# Patient Record
Sex: Male | Born: 1945
Health system: Southern US, Community
[De-identification: ages and names within clinical notes are randomized; demographics above are authoritative.]

## PROBLEM LIST (undated history)

## (undated) DIAGNOSIS — E785 Hyperlipidemia, unspecified: Secondary | ICD-10-CM

## (undated) DIAGNOSIS — I10 Essential (primary) hypertension: Secondary | ICD-10-CM

---

## 2014-03-03 ENCOUNTER — Emergency Department (HOSPITAL_COMMUNITY)
Admission: EM | Admit: 2014-03-03 | Discharge: 2014-03-03 | Disposition: A | Discharge status: Home / Self Care | Payer: 59 | Source: Home / Self Care | Attending: Emergency Medicine | Admitting: Emergency Medicine

## 2014-03-03 ENCOUNTER — Encounter (HOSPITAL_COMMUNITY): Payer: Self-pay | Admitting: *Deleted

## 2014-03-03 DIAGNOSIS — K040 Pulpitis: Secondary | ICD-10-CM

## 2014-03-03 DIAGNOSIS — K0401 Reversible pulpitis: Secondary | ICD-10-CM

## 2014-03-03 MED ORDER — HYDROCODONE-ACETAMINOPHEN 5-325 MG PO TABS: ORAL_TABLET | ORAL | Status: DC

## 2014-03-03 MED ORDER — IBUPROFEN 800 MG PO TABS: 800.0000 mg | ORAL_TABLET | Freq: Once | ORAL | Status: DC

## 2014-03-03 MED ORDER — AMOXICILLIN-POT CLAVULANATE 875-125 MG PO TABS: 1.0000 | ORAL_TABLET | Freq: Two times a day (BID) | ORAL | Status: DC

## 2014-03-03 MED ORDER — MELOXICAM 15 MG PO TABS: 15.0000 mg | ORAL_TABLET | Freq: Every day | ORAL | Status: DC

## 2014-03-03 MED ORDER — IBUPROFEN 800 MG PO TABS
ORAL_TABLET | ORAL | Status: AC
  Filled 2014-03-03: qty 1

## 2014-03-03 NOTE — Discharge Instructions (Signed)
Look up the [facility] [employer] for free dental clinics. Http://www.ncdental.org/ncds/Schedule.asp ° °Get there early and be prepared to wait. [employer] and [employer] have dental hygienist schools that provide low cost routine dental care.  ° °Other resources: °[facility] °[address] °[place], [place] °[phone] ° °Patients with Medicaid: °[facility]                     [facility] °[address]                                [address] °Phone:  [phone]                                                  Phone:  [phone] ° °Dr. Janice Civils °[address] °[phone] ° °If unable to pay or uninsured, contact:  [facility] or [facility] to become qualified for the adult dental clinic. ° °No matter what dental problem you have, it will not get better unless you get good dental care.  If the tooth is not taken care of, your symptoms will come back in time and you will be visiting us again in the Urgent Care Center with a bad toothache.  So, see your dentist as soon as possible.  If you don't have a dentist, we can give you a list of dentists.  Sometimes the most cost effective treatment is removal of the tooth.  This can be done very inexpensively through one of the low cost [facility] such as the facility on [address] in [place] ([phone]).  The downside to this is that you will have one less tooth and this can effect your ability to chew. ° °Some other things that can be done for a dental infection include the following: ° °· Rinse your mouth out with hot salt water (1/2 tsp of table salt and a pinch of baking soda in 8 oz of hot water).  You can do this every 2 or 3 hours. °· Avoid cold foods, beverages, and cold air.  This will make your symptoms worse. °· Sleep with your head elevated.  Sleeping flat will cause your gums and oral tissues to swell and make them hurt  more.  You can sleep on several pillows.  Even better is to sleep in a recliner with your head higher than your heart. °· For mild to moderate pain, you can take Tylenol, ibuprofen, or Aleve. °· External application of heat by a heating pad, hot water bottle, or hot wet towel can help with pain and speed healing.  You can do this every 2 to 3 hours. Do not fall asleep on a heating pad since this can cause a burn.  ° °Go to www.goodrx.com to look up your medications. This will give you a list of where you can find your prescriptions at the most affordable prices.  ° °RESOURCE GUIDE ° °Dental Problems ° °Look up http://www.ncdental.org/ncds/Schedule.asp for a schedule of the [employer]'s free dental clinics called [facility]. They have clinics all around [place]. Get there early and be prepared to wait.  ° °[facility] °[address]  [place], [place]   [address] °[phone] ° °[facility] °[address] °[place], [place] °[phone] ° °Patients with Medicaid: °[facility]                     [facility] °[address]                                [address] °Phone:  [phone]                                                  Phone:  [phone] ° °If unable to pay or uninsured, contact:  [facility] or [facility] to become qualified for the adult dental clinic. ° °

## 2014-03-03 NOTE — ED Provider Notes (Signed)
   Chief Complaint   Dental Pain   History of Present Illness   Thomas Harrison is a 69 year old male who's had a two-day history of pain in his right, lower, lateral incisor. This tooth has been broken for years. There is some swelling of the gingiva. It hurts to chew on that side. No difficulty swallowing or breathing. He can open his mouth fully. He denies any pain or swelling in the neck. No fever, chills, headache, chest pain, or shortness of breath.  Review of Systems   Other than as noted above, the patient denies any of the following symptoms: Systemic:  No fever or chills. ENT:  No headache, ear ache, sore throat, nasal congestion, facial pain, or swelling. Neck:  No adenopathy or neck swelling. Lungs:  No coughing or shortness of breath.  PMFSH   Past medical history, family history, social history, meds, and allergies were reviewed.   Physical Examination     Vital signs:  BP 143/92 mmHg  Pulse 77  Temp(Src) 98.3 F (36.8 C) (Oral)  Resp 12  SpO2 97% General:  Alert, oriented, in no distress. ENT:  TMs and canals normal.  Nasal mucosa normal. Mouth exam:  Widespread dental decay. The right, lower, lateral incisor was decayed down to the gumline. The gingiva was inflamed. There was no collection of pus. No swelling of the floor the mouth or on the tongue. The pharynx was clear. Neck:  No swelling or adenopathy. Lungs:  Breath sounds clear and equal bilaterally.  No wheezes, rales or rhonchi. Heart:  Regular rhythm.  No gallops or murmers. Skin:  Clear, warm and dry.   Assessment   The encounter diagnosis was Pulpitis.  No evidence of Ludwig's angina.    Plan   1.  Meds:  The following meds were prescribed:   Discharge Medication List as of 03/03/2014  3:44 PM    START taking these medications   Details  amoxicillin-clavulanate (AUGMENTIN) 875-125 MG per tablet Take 1 tablet by mouth 2 (two) times daily., Starting 03/03/2014, Until Discontinued, Normal      HYDROcodone-acetaminophen (NORCO/VICODIN) 5-325 MG per tablet 1 to 2 tabs every 4 to 6 hours as needed for pain., Print    meloxicam (MOBIC) 15 MG tablet Take 1 tablet (15 mg total) by mouth daily., Starting 03/03/2014, Until Discontinued, Normal        2.  Patient Education/Counseling:  The patient was given appropriate handouts, self care instructions, and instructed in pain control. Suggested sleeping with head of bed elevated and hot salt water mouthwash.   3.  Follow up:  The patient was told to follow up if no better in 3 to 4 days, if becoming worse in any way, and given some red flag symptoms such as difficulty swallowing or breathing which would prompt immediate return.  Follow up with a dentist as soon as posssible.     Reuben Likes, MD 03/03/14 2039

## 2014-03-03 NOTE — ED Notes (Signed)
Pt reports  A  Toothache           For   sev  Days  r  Upper  Side      -  She  Reports       A  History  Of  Broken tooth  In  Past              He  Is  Sitting  Upright on the  Exam table  Speaking in  Complete  sentances  In no  Acute  Distress

## 2014-03-12 ENCOUNTER — Telehealth (HOSPITAL_COMMUNITY): Payer: Self-pay | Admitting: *Deleted

## 2014-03-12 MED ORDER — AMOXICILLIN 500 MG PO CAPS: 500.0000 mg | ORAL_CAPSULE | Freq: Three times a day (TID) | ORAL | Status: DC

## 2014-03-12 NOTE — ED Notes (Signed)
Pt. said this is his 2nd call today. He can't keep the Augmentin down.  I told him the nurse he talked to earlier is waiting for Dr. Lorenz CoasterKeller to come. She will find out what the doctor wants to do and call him back. Vassie MoselleYork, Natalin Bible M 03/12/2014

## 2014-03-12 NOTE — ED Notes (Signed)
The patient states that the omentum is causing nausea and vomiting. He was told to stop this medication, we'll call in a prescription for amoxicillin instead. The patient has been able to tolerate this medication in the past.  Reuben Likesavid C Lillith Mcneff, MD 03/12/14 1919

## 2014-07-17 ENCOUNTER — Other Ambulatory Visit: Payer: 59

## 2014-07-17 DIAGNOSIS — B182 Chronic viral hepatitis C: Secondary | ICD-10-CM

## 2014-07-17 LAB — CBC WITH DIFFERENTIAL/PLATELET
Basophils Absolute: 0.1 10*3/uL (ref 0.0–0.1)
Basophils Relative: 1 % (ref 0–1)
Eosinophils Absolute: 0.2 10*3/uL (ref 0.0–0.7)
Eosinophils Relative: 4 % (ref 0–5)
HCT: 45.2 % (ref 39.0–52.0)
Hemoglobin: 15.3 g/dL (ref 13.0–17.0)
Lymphocytes Relative: 48 % — ABNORMAL HIGH (ref 12–46)
Lymphs Abs: 2.4 10*3/uL (ref 0.7–4.0)
MCH: 30.7 pg (ref 26.0–34.0)
MCHC: 33.8 g/dL (ref 30.0–36.0)
MCV: 90.8 fL (ref 78.0–100.0)
MPV: 10.9 fL (ref 8.6–12.4)
Monocytes Absolute: 0.4 10*3/uL (ref 0.1–1.0)
Monocytes Relative: 8 % (ref 3–12)
Neutro Abs: 2 10*3/uL (ref 1.7–7.7)
Neutrophils Relative %: 39 % — ABNORMAL LOW (ref 43–77)
Platelets: 248 10*3/uL (ref 150–400)
RBC: 4.98 MIL/uL (ref 4.22–5.81)
RDW: 13.1 % (ref 11.5–15.5)
WBC: 5.1 10*3/uL (ref 4.0–10.5)

## 2014-07-17 LAB — COMPREHENSIVE METABOLIC PANEL
ALT: 37 U/L (ref 0–53)
AST: 34 U/L (ref 0–37)
Albumin: 3.9 g/dL (ref 3.5–5.2)
Alkaline Phosphatase: 115 U/L (ref 39–117)
BUN: 12 mg/dL (ref 6–23)
CO2: 28 mEq/L (ref 19–32)
Calcium: 9.2 mg/dL (ref 8.4–10.5)
Chloride: 105 mEq/L (ref 96–112)
Creat: 1.04 mg/dL (ref 0.50–1.35)
Glucose, Bld: 90 mg/dL (ref 70–99)
Potassium: 4.4 mEq/L (ref 3.5–5.3)
Sodium: 141 mEq/L (ref 135–145)
Total Bilirubin: 0.5 mg/dL (ref 0.2–1.2)
Total Protein: 6.9 g/dL (ref 6.0–8.3)

## 2014-07-17 LAB — HIV ANTIBODY (ROUTINE TESTING W REFLEX): HIV 1&2 Ab, 4th Generation: NONREACTIVE

## 2014-07-17 LAB — IRON: Iron: 88 ug/dL (ref 42–165)

## 2014-07-17 LAB — HEPATITIS B SURFACE ANTIBODY,QUALITATIVE: Hep B S Ab: POSITIVE — AB

## 2014-07-18 LAB — PROTIME-INR
INR: 0.98 (ref <1.50)
Prothrombin Time: 13 seconds (ref 11.6–15.2)

## 2014-07-18 LAB — ANA: Anti Nuclear Antibody(ANA): NEGATIVE

## 2014-07-24 LAB — HEPATITIS C GENOTYPE

## 2014-07-27 ENCOUNTER — Encounter (HOSPITAL_COMMUNITY): Payer: Self-pay | Admitting: Emergency Medicine

## 2014-07-27 ENCOUNTER — Emergency Department (INDEPENDENT_AMBULATORY_CARE_PROVIDER_SITE_OTHER)
Admission: EM | Admit: 2014-07-27 | Discharge: 2014-07-27 | Disposition: A | Discharge status: Home / Self Care | Payer: 59 | Source: Home / Self Care | Attending: Family Medicine | Admitting: Family Medicine

## 2014-07-27 DIAGNOSIS — K088 Other specified disorders of teeth and supporting structures: Secondary | ICD-10-CM | POA: Diagnosis not present

## 2014-07-27 DIAGNOSIS — K0889 Other specified disorders of teeth and supporting structures: Secondary | ICD-10-CM

## 2014-07-27 MED ORDER — CLINDAMYCIN HCL 150 MG PO CAPS: 150.0000 mg | ORAL_CAPSULE | Freq: Four times a day (QID) | ORAL | Status: DC

## 2014-07-27 MED ORDER — DICLOFENAC POTASSIUM 50 MG PO TABS: 50.0000 mg | ORAL_TABLET | Freq: Three times a day (TID) | ORAL | Status: DC

## 2014-07-27 NOTE — ED Provider Notes (Signed)
CSN: [id number]     Arrival date & time 07/27/14  [id number] History   First MD Initiated Contact with Patient 07/27/14 1100     No chief complaint on file.  (Consider location/radiation/quality/duration/timing/severity/associated sxs/prior Treatment) Patient is a 69 y.o. male presenting with tooth pain. The history is provided by the patient.  Dental Pain Location:  Upper Upper teeth location:  8/RU central incisor and 9/LU central incisor Quality:  Throbbing Severity:  Moderate Onset quality:  Sudden Progression:  Worsening Chronicity:  New Context: abscess, poor dentition and recent dental surgery   Context comment:  S/p dental extraction on tues, no abx, but given pain pills, continues with dental pain Relieved by:  None tried Worsened by:  Nothing tried Ineffective treatments:  None tried   No past medical history on file. No past surgical history on file. No family history on file. History  Substance Use Topics  . Smoking status: Current Every Day Smoker  . Smokeless tobacco: Not on file  . Alcohol Use: Yes    Review of Systems  Constitutional: Negative.   HENT: Positive for dental problem.     Allergies  Review of patient's allergies indicates not on file.  Home Medications   Prior to Admission medications   Medication Sig Start Date End Date Taking? Authorizing Provider  amoxicillin (AMOXIL) 500 MG capsule Take 1 capsule (500 mg total) by mouth 3 (three) times daily. 03/12/14   Reuben Likesavid C Keller, MD  amoxicillin-clavulanate (AUGMENTIN) 875-125 MG per tablet Take 1 tablet by mouth 2 (two) times daily. 03/03/14   Reuben Likesavid C Keller, MD  clindamycin (CLEOCIN) 150 MG capsule Take 1 capsule (150 mg total) by mouth 4 (four) times daily. 07/27/14   Linna HoffJames D Alayzia Pavlock, MD  diclofenac (CATAFLAM) 50 MG tablet Take 1 tablet (50 mg total) by mouth 3 (three) times daily. For dental pain 07/27/14   Linna HoffJames D Kajal Scalici, MD  HYDROcodone-acetaminophen (NORCO/VICODIN) 5-325 MG per tablet 1 to 2 tabs every 4  to 6 hours as needed for pain. 03/03/14   Reuben Likesavid C Keller, MD  meloxicam (MOBIC) 15 MG tablet Take 1 tablet (15 mg total) by mouth daily. 03/03/14   Reuben Likesavid C Keller, MD   BP 132/91 mmHg  Pulse 70  Temp(Src) 98.7 F (37.1 C) (Oral)  Resp 16  SpO2 97% Physical Exam  Constitutional: He is oriented to person, place, and time. He appears well-developed and well-nourished.  HENT:  Mouth/Throat: Uvula is midline. Abnormal dentition. Dental abscesses and dental caries present.    Neck: Normal range of motion. Neck supple.  Lymphadenopathy:    He has no cervical adenopathy.  Neurological: He is alert and oriented to person, place, and time.  Skin: Skin is warm and dry.  Nursing note and vitals reviewed.   ED Course  Procedures (including critical care time) Labs Review Labs Reviewed - No data to display  Imaging Review No results found.   MDM   1. Pain, dental        Linna HoffJames D Jeiden Daughtridge, MD 07/27/14 1115

## 2014-07-27 NOTE — Discharge Instructions (Signed)
Take medicine as prescribed, see your dentist as soon as possible °

## 2014-07-27 NOTE — ED Notes (Signed)
Pt states that he got 2 teeth pulled on Tuesday 07/23/2014 and that he has been having problems since then with pain which is not normal when he gets his teeth pulled.

## 2014-08-12 ENCOUNTER — Encounter: Payer: 59 | Admitting: Internal Medicine

## 2014-10-02 ENCOUNTER — Encounter: Payer: 59 | Admitting: Internal Medicine

## 2014-10-02 ENCOUNTER — Telehealth: Payer: Self-pay | Admitting: *Deleted

## 2014-10-02 NOTE — Telephone Encounter (Signed)
Patient no showed x 2 with Dr. Luciana Axe for new patient hep C appointment. Due to the fact that there are many new patients to schedule, per Dr. Luciana Axe he will have to go to the end of the list to be rescheduled. Original appt was in June 2016. [name]

## 2014-10-07 ENCOUNTER — Telehealth: Payer: Self-pay | Admitting: *Deleted

## 2014-10-07 NOTE — Telephone Encounter (Signed)
Left patient a voice mail to return my call. Need new referral from his PCP for Dr. Luciana Axe. [name]

## 2014-10-30 ENCOUNTER — Telehealth: Payer: Self-pay | Admitting: Lab

## 2014-10-30 NOTE — Telephone Encounter (Signed)
REFERRAL FOR HEP C-Patient No showed  For Dr Luciana Axe on 08/03/ and 08/12/2014- Cant locate referral or PCP to inform

## 2015-03-27 DIAGNOSIS — K0261 Dental caries on smooth surface limited to enamel: Secondary | ICD-10-CM | POA: Diagnosis not present

## 2015-03-27 DIAGNOSIS — B182 Chronic viral hepatitis C: Secondary | ICD-10-CM | POA: Diagnosis not present

## 2015-03-27 DIAGNOSIS — E786 Lipoprotein deficiency: Secondary | ICD-10-CM | POA: Diagnosis not present

## 2015-03-27 DIAGNOSIS — Z6824 Body mass index (BMI) 24.0-24.9, adult: Secondary | ICD-10-CM | POA: Diagnosis not present

## 2015-03-27 DIAGNOSIS — F3289 Other specified depressive episodes: Secondary | ICD-10-CM | POA: Diagnosis not present

## 2015-06-04 DIAGNOSIS — B182 Chronic viral hepatitis C: Secondary | ICD-10-CM | POA: Diagnosis not present

## 2015-06-05 ENCOUNTER — Other Ambulatory Visit (HOSPITAL_COMMUNITY): Payer: Self-pay | Admitting: Nurse Practitioner

## 2015-06-05 DIAGNOSIS — B182 Chronic viral hepatitis C: Secondary | ICD-10-CM

## 2015-06-19 DIAGNOSIS — B182 Chronic viral hepatitis C: Secondary | ICD-10-CM | POA: Diagnosis not present

## 2015-06-24 ENCOUNTER — Ambulatory Visit (HOSPITAL_COMMUNITY)
Admission: RE | Admit: 2015-06-24 | Discharge: 2015-06-24 | Disposition: A | Discharge status: Home / Self Care | Payer: Commercial Managed Care - HMO | Source: Ambulatory Visit | Attending: Nurse Practitioner | Admitting: Nurse Practitioner

## 2015-06-24 DIAGNOSIS — B182 Chronic viral hepatitis C: Secondary | ICD-10-CM | POA: Diagnosis not present

## 2015-07-01 ENCOUNTER — Other Ambulatory Visit: Payer: Self-pay | Admitting: Family Medicine

## 2015-07-01 DIAGNOSIS — K5710 Diverticulosis of small intestine without perforation or abscess without bleeding: Secondary | ICD-10-CM | POA: Diagnosis not present

## 2015-07-01 DIAGNOSIS — R109 Unspecified abdominal pain: Secondary | ICD-10-CM

## 2015-07-01 DIAGNOSIS — E786 Lipoprotein deficiency: Secondary | ICD-10-CM | POA: Diagnosis not present

## 2015-07-01 DIAGNOSIS — K0261 Dental caries on smooth surface limited to enamel: Secondary | ICD-10-CM | POA: Diagnosis not present

## 2015-07-01 DIAGNOSIS — B182 Chronic viral hepatitis C: Secondary | ICD-10-CM | POA: Diagnosis not present

## 2015-07-02 ENCOUNTER — Ambulatory Visit
Admission: RE | Admit: 2015-07-02 | Discharge: 2015-07-02 | Disposition: A | Discharge status: Home / Self Care | Payer: Commercial Managed Care - HMO | Source: Ambulatory Visit | Attending: Family Medicine | Admitting: Family Medicine

## 2015-07-02 DIAGNOSIS — R1032 Left lower quadrant pain: Secondary | ICD-10-CM | POA: Diagnosis not present

## 2015-07-02 DIAGNOSIS — R109 Unspecified abdominal pain: Secondary | ICD-10-CM

## 2015-07-02 MED ORDER — IOPAMIDOL (ISOVUE-300) INJECTION 61%
100.0000 mL | Freq: Once | INTRAVENOUS | Status: AC | PRN
  Administered 2015-07-02: 100 mL via INTRAVENOUS

## 2015-07-08 DIAGNOSIS — M25552 Pain in left hip: Secondary | ICD-10-CM | POA: Diagnosis not present

## 2015-07-08 DIAGNOSIS — M545 Low back pain: Secondary | ICD-10-CM | POA: Diagnosis not present

## 2015-09-22 DIAGNOSIS — K740 Hepatic fibrosis: Secondary | ICD-10-CM | POA: Diagnosis not present

## 2015-09-22 DIAGNOSIS — B182 Chronic viral hepatitis C: Secondary | ICD-10-CM | POA: Diagnosis not present

## 2015-11-12 DIAGNOSIS — B182 Chronic viral hepatitis C: Secondary | ICD-10-CM | POA: Diagnosis not present

## 2015-11-17 DIAGNOSIS — K740 Hepatic fibrosis: Secondary | ICD-10-CM | POA: Diagnosis not present

## 2015-11-17 DIAGNOSIS — B182 Chronic viral hepatitis C: Secondary | ICD-10-CM | POA: Diagnosis not present

## 2016-02-16 DIAGNOSIS — B182 Chronic viral hepatitis C: Secondary | ICD-10-CM | POA: Diagnosis not present

## 2016-03-24 ENCOUNTER — Other Ambulatory Visit: Payer: Self-pay | Admitting: Nurse Practitioner

## 2016-03-24 DIAGNOSIS — K740 Hepatic fibrosis: Secondary | ICD-10-CM | POA: Diagnosis not present

## 2016-03-24 DIAGNOSIS — B182 Chronic viral hepatitis C: Secondary | ICD-10-CM | POA: Diagnosis not present

## 2016-03-24 DIAGNOSIS — K7469 Other cirrhosis of liver: Secondary | ICD-10-CM

## 2016-03-31 ENCOUNTER — Ambulatory Visit
Admission: RE | Admit: 2016-03-31 | Discharge: 2016-03-31 | Disposition: A | Discharge status: Home / Self Care | Payer: Medicare HMO | Source: Ambulatory Visit | Attending: Nurse Practitioner | Admitting: Nurse Practitioner

## 2016-03-31 DIAGNOSIS — K7469 Other cirrhosis of liver: Secondary | ICD-10-CM

## 2016-03-31 DIAGNOSIS — K7460 Unspecified cirrhosis of liver: Secondary | ICD-10-CM | POA: Diagnosis not present

## 2016-05-05 DIAGNOSIS — B182 Chronic viral hepatitis C: Secondary | ICD-10-CM | POA: Diagnosis not present

## 2016-05-05 DIAGNOSIS — K5710 Diverticulosis of small intestine without perforation or abscess without bleeding: Secondary | ICD-10-CM | POA: Diagnosis not present

## 2016-05-05 DIAGNOSIS — Z0000 Encounter for general adult medical examination without abnormal findings: Secondary | ICD-10-CM | POA: Diagnosis not present

## 2016-06-24 DIAGNOSIS — Z0000 Encounter for general adult medical examination without abnormal findings: Secondary | ICD-10-CM | POA: Diagnosis not present

## 2016-06-30 DIAGNOSIS — H578 Other specified disorders of eye and adnexa: Secondary | ICD-10-CM | POA: Diagnosis not present

## 2016-06-30 DIAGNOSIS — T1502XA Foreign body in cornea, left eye, initial encounter: Secondary | ICD-10-CM | POA: Diagnosis not present

## 2016-06-30 DIAGNOSIS — S0502XA Injury of conjunctiva and corneal abrasion without foreign body, left eye, initial encounter: Secondary | ICD-10-CM | POA: Diagnosis not present

## 2016-07-01 DIAGNOSIS — H578 Other specified disorders of eye and adnexa: Secondary | ICD-10-CM | POA: Diagnosis not present

## 2016-07-01 DIAGNOSIS — S0501XA Injury of conjunctiva and corneal abrasion without foreign body, right eye, initial encounter: Secondary | ICD-10-CM | POA: Diagnosis not present

## 2016-07-01 DIAGNOSIS — T1502XA Foreign body in cornea, left eye, initial encounter: Secondary | ICD-10-CM | POA: Diagnosis not present

## 2016-07-03 DIAGNOSIS — H20011 Primary iridocyclitis, right eye: Secondary | ICD-10-CM | POA: Diagnosis not present

## 2016-08-04 DIAGNOSIS — B353 Tinea pedis: Secondary | ICD-10-CM | POA: Diagnosis not present

## 2016-08-04 DIAGNOSIS — K029 Dental caries, unspecified: Secondary | ICD-10-CM | POA: Diagnosis not present

## 2016-08-04 DIAGNOSIS — E786 Lipoprotein deficiency: Secondary | ICD-10-CM | POA: Diagnosis not present

## 2016-08-04 DIAGNOSIS — Z0000 Encounter for general adult medical examination without abnormal findings: Secondary | ICD-10-CM | POA: Diagnosis not present

## 2016-08-04 DIAGNOSIS — K0261 Dental caries on smooth surface limited to enamel: Secondary | ICD-10-CM | POA: Diagnosis not present

## 2016-08-04 DIAGNOSIS — M130 Polyarthritis, unspecified: Secondary | ICD-10-CM | POA: Diagnosis not present

## 2016-12-24 DIAGNOSIS — N50819 Testicular pain, unspecified: Secondary | ICD-10-CM | POA: Diagnosis not present

## 2016-12-24 DIAGNOSIS — Z23 Encounter for immunization: Secondary | ICD-10-CM | POA: Diagnosis not present

## 2017-02-23 DIAGNOSIS — R102 Pelvic and perineal pain: Secondary | ICD-10-CM | POA: Diagnosis not present

## 2017-03-25 DIAGNOSIS — B353 Tinea pedis: Secondary | ICD-10-CM | POA: Diagnosis not present

## 2017-08-22 DIAGNOSIS — Z8249 Family history of ischemic heart disease and other diseases of the circulatory system: Secondary | ICD-10-CM | POA: Diagnosis not present

## 2017-08-22 DIAGNOSIS — Z825 Family history of asthma and other chronic lower respiratory diseases: Secondary | ICD-10-CM | POA: Diagnosis not present

## 2017-08-22 DIAGNOSIS — Z720 Tobacco use: Secondary | ICD-10-CM | POA: Diagnosis not present

## 2017-08-22 DIAGNOSIS — Z833 Family history of diabetes mellitus: Secondary | ICD-10-CM | POA: Diagnosis not present

## 2017-08-22 DIAGNOSIS — G8929 Other chronic pain: Secondary | ICD-10-CM | POA: Diagnosis not present

## 2017-09-21 DIAGNOSIS — E785 Hyperlipidemia, unspecified: Secondary | ICD-10-CM | POA: Diagnosis not present

## 2017-09-21 DIAGNOSIS — Z6821 Body mass index (BMI) 21.0-21.9, adult: Secondary | ICD-10-CM | POA: Diagnosis not present

## 2017-09-21 DIAGNOSIS — T672XXA Heat cramp, initial encounter: Secondary | ICD-10-CM | POA: Diagnosis not present

## 2018-01-20 DIAGNOSIS — R69 Illness, unspecified: Secondary | ICD-10-CM | POA: Diagnosis not present

## 2018-01-20 DIAGNOSIS — N429 Disorder of prostate, unspecified: Secondary | ICD-10-CM | POA: Diagnosis not present

## 2018-01-20 DIAGNOSIS — J399 Disease of upper respiratory tract, unspecified: Secondary | ICD-10-CM | POA: Diagnosis not present

## 2018-01-20 DIAGNOSIS — E785 Hyperlipidemia, unspecified: Secondary | ICD-10-CM | POA: Diagnosis not present

## 2018-01-20 DIAGNOSIS — K219 Gastro-esophageal reflux disease without esophagitis: Secondary | ICD-10-CM | POA: Diagnosis not present

## 2018-01-20 DIAGNOSIS — K029 Dental caries, unspecified: Secondary | ICD-10-CM | POA: Diagnosis not present

## 2018-01-20 DIAGNOSIS — M130 Polyarthritis, unspecified: Secondary | ICD-10-CM | POA: Diagnosis not present

## 2018-01-20 DIAGNOSIS — Z0000 Encounter for general adult medical examination without abnormal findings: Secondary | ICD-10-CM | POA: Diagnosis not present

## 2018-07-21 DIAGNOSIS — R252 Cramp and spasm: Secondary | ICD-10-CM | POA: Diagnosis not present

## 2018-07-21 DIAGNOSIS — E782 Mixed hyperlipidemia: Secondary | ICD-10-CM | POA: Diagnosis not present

## 2018-07-21 DIAGNOSIS — M130 Polyarthritis, unspecified: Secondary | ICD-10-CM | POA: Diagnosis not present

## 2018-08-14 ENCOUNTER — Other Ambulatory Visit: Payer: Self-pay

## 2018-08-14 ENCOUNTER — Ambulatory Visit (HOSPITAL_COMMUNITY)
Admission: RE | Admit: 2018-08-14 | Discharge: 2018-08-14 | Disposition: A | Discharge status: Home / Self Care | Payer: Medicare HMO | Source: Ambulatory Visit | Attending: Family Medicine | Admitting: Family Medicine

## 2018-08-14 ENCOUNTER — Other Ambulatory Visit (HOSPITAL_COMMUNITY): Payer: Self-pay | Admitting: Family Medicine

## 2018-08-14 DIAGNOSIS — I739 Peripheral vascular disease, unspecified: Secondary | ICD-10-CM | POA: Insufficient documentation

## 2018-08-14 DIAGNOSIS — R252 Cramp and spasm: Secondary | ICD-10-CM

## 2018-08-15 ENCOUNTER — Ambulatory Visit (HOSPITAL_COMMUNITY)
Admission: RE | Admit: 2018-08-15 | Discharge: 2018-08-15 | Disposition: A | Discharge status: Home / Self Care | Payer: Medicare HMO | Source: Ambulatory Visit | Attending: Family | Admitting: Family

## 2018-08-15 DIAGNOSIS — R252 Cramp and spasm: Secondary | ICD-10-CM | POA: Insufficient documentation

## 2018-08-21 DIAGNOSIS — M130 Polyarthritis, unspecified: Secondary | ICD-10-CM | POA: Diagnosis not present

## 2018-08-21 DIAGNOSIS — R252 Cramp and spasm: Secondary | ICD-10-CM | POA: Diagnosis not present

## 2018-08-21 DIAGNOSIS — E782 Mixed hyperlipidemia: Secondary | ICD-10-CM | POA: Diagnosis not present

## 2018-11-21 DIAGNOSIS — M130 Polyarthritis, unspecified: Secondary | ICD-10-CM | POA: Diagnosis not present

## 2018-11-21 DIAGNOSIS — E782 Mixed hyperlipidemia: Secondary | ICD-10-CM | POA: Diagnosis not present

## 2018-12-19 DIAGNOSIS — K08409 Partial loss of teeth, unspecified cause, unspecified class: Secondary | ICD-10-CM | POA: Diagnosis not present

## 2018-12-19 DIAGNOSIS — Z833 Family history of diabetes mellitus: Secondary | ICD-10-CM | POA: Diagnosis not present

## 2018-12-19 DIAGNOSIS — Z8249 Family history of ischemic heart disease and other diseases of the circulatory system: Secondary | ICD-10-CM | POA: Diagnosis not present

## 2018-12-19 DIAGNOSIS — Z825 Family history of asthma and other chronic lower respiratory diseases: Secondary | ICD-10-CM | POA: Diagnosis not present

## 2018-12-19 DIAGNOSIS — Z809 Family history of malignant neoplasm, unspecified: Secondary | ICD-10-CM | POA: Diagnosis not present

## 2018-12-19 DIAGNOSIS — Z720 Tobacco use: Secondary | ICD-10-CM | POA: Diagnosis not present

## 2018-12-19 DIAGNOSIS — R030 Elevated blood-pressure reading, without diagnosis of hypertension: Secondary | ICD-10-CM | POA: Diagnosis not present

## 2018-12-19 DIAGNOSIS — K219 Gastro-esophageal reflux disease without esophagitis: Secondary | ICD-10-CM | POA: Diagnosis not present

## 2019-01-24 DIAGNOSIS — Z03818 Encounter for observation for suspected exposure to other biological agents ruled out: Secondary | ICD-10-CM | POA: Diagnosis not present

## 2019-03-23 DIAGNOSIS — E782 Mixed hyperlipidemia: Secondary | ICD-10-CM | POA: Diagnosis not present

## 2019-03-23 DIAGNOSIS — I10 Essential (primary) hypertension: Secondary | ICD-10-CM | POA: Diagnosis not present

## 2019-05-25 ENCOUNTER — Ambulatory Visit: Payer: Medicare Other | Attending: Internal Medicine

## 2019-05-25 DIAGNOSIS — Z23 Encounter for immunization: Secondary | ICD-10-CM

## 2019-05-25 NOTE — Progress Notes (Signed)
   Covid-19 Vaccination Clinic  Name:  Quinnten Calvin    MRN: [id number] DOB: Nov 22, 1945  05/25/2019  Mr. Rafalski was observed post Covid-19 immunization for 15 minutes without incident. He was provided with Vaccine Information Sheet and instruction to access the V-Safe system.   Mr. Mcbee was instructed to call 911 with any severe reactions post vaccine: Marland Kitchen Difficulty breathing  . Swelling of face and throat  . A fast heartbeat  . A bad rash all over body  . Dizziness and weakness   Immunizations Administered    Name Date Dose VIS Date Route   Pfizer COVID-19 Vaccine 05/25/2019 10:38 AM 0.3 mL 02/09/2019 Intramuscular   Manufacturer: [employer], [employer]   Lot: [id number]   NDC: [address]-1

## 2019-06-18 ENCOUNTER — Ambulatory Visit: Payer: Medicare Other

## 2019-06-19 ENCOUNTER — Ambulatory Visit: Payer: Medicare Other | Attending: Internal Medicine

## 2019-06-19 DIAGNOSIS — Z23 Encounter for immunization: Secondary | ICD-10-CM

## 2019-06-19 NOTE — Progress Notes (Signed)
   Covid-19 Vaccination Clinic  Name:  Thomas Harrison    MRN: [id number] DOB: 1945/03/12  06/19/2019  Mr. Thomas Harrison was observed post Covid-19 immunization for 15 minutes without incident. He was provided with Vaccine Information Sheet and instruction to access the V-Safe system.   Mr. Thomas Harrison was instructed to call 911 with any severe reactions post vaccine: Marland Kitchen Difficulty breathing  . Swelling of face and throat  . A fast heartbeat  . A bad rash all over body  . Dizziness and weakness   Immunizations Administered    Name Date Dose VIS Date Route   Pfizer COVID-19 Vaccine 06/19/2019 12:48 PM 0.3 mL 04/25/2018 Intramuscular   Manufacturer: [employer], [employer]   Lot: [id number]   NDC: [address]-5

## 2019-06-30 DIAGNOSIS — S61432A Puncture wound without foreign body of left hand, initial encounter: Secondary | ICD-10-CM | POA: Diagnosis not present

## 2019-06-30 DIAGNOSIS — Z23 Encounter for immunization: Secondary | ICD-10-CM | POA: Diagnosis not present

## 2019-06-30 DIAGNOSIS — W268XXA Contact with other sharp object(s), not elsewhere classified, initial encounter: Secondary | ICD-10-CM | POA: Diagnosis not present

## 2019-07-12 DIAGNOSIS — Z1211 Encounter for screening for malignant neoplasm of colon: Secondary | ICD-10-CM | POA: Diagnosis not present

## 2019-07-12 DIAGNOSIS — R109 Unspecified abdominal pain: Secondary | ICD-10-CM | POA: Diagnosis not present

## 2019-07-12 DIAGNOSIS — Z86010 Personal history of colonic polyps: Secondary | ICD-10-CM | POA: Diagnosis not present

## 2019-07-12 DIAGNOSIS — Z800 Family history of malignant neoplasm of digestive organs: Secondary | ICD-10-CM | POA: Diagnosis not present

## 2019-08-24 DIAGNOSIS — Z800 Family history of malignant neoplasm of digestive organs: Secondary | ICD-10-CM | POA: Diagnosis not present

## 2019-08-24 DIAGNOSIS — Z86010 Personal history of colonic polyps: Secondary | ICD-10-CM | POA: Diagnosis not present

## 2019-08-24 DIAGNOSIS — Z1211 Encounter for screening for malignant neoplasm of colon: Secondary | ICD-10-CM | POA: Diagnosis not present

## 2020-07-12 ENCOUNTER — Encounter (HOSPITAL_COMMUNITY): Payer: Self-pay

## 2020-07-12 ENCOUNTER — Inpatient Hospital Stay (HOSPITAL_BASED_OUTPATIENT_CLINIC_OR_DEPARTMENT_OTHER): Payer: Medicare Other

## 2020-07-12 ENCOUNTER — Emergency Department (HOSPITAL_COMMUNITY): Payer: Medicare Other

## 2020-07-12 ENCOUNTER — Other Ambulatory Visit: Payer: Self-pay

## 2020-07-12 ENCOUNTER — Observation Stay (HOSPITAL_COMMUNITY)
Admission: EM | Admit: 2020-07-12 | Discharge: 2020-07-13 | Disposition: A | Discharge status: Home / Self Care | Payer: Medicare Other | Attending: Neurology | Admitting: Neurology

## 2020-07-12 DIAGNOSIS — I6389 Other cerebral infarction: Secondary | ICD-10-CM

## 2020-07-12 DIAGNOSIS — G459 Transient cerebral ischemic attack, unspecified: Secondary | ICD-10-CM | POA: Diagnosis not present

## 2020-07-12 DIAGNOSIS — R29810 Facial weakness: Secondary | ICD-10-CM | POA: Diagnosis present

## 2020-07-12 DIAGNOSIS — I10 Essential (primary) hypertension: Secondary | ICD-10-CM | POA: Insufficient documentation

## 2020-07-12 DIAGNOSIS — Z79899 Other long term (current) drug therapy: Secondary | ICD-10-CM | POA: Insufficient documentation

## 2020-07-12 DIAGNOSIS — F17210 Nicotine dependence, cigarettes, uncomplicated: Secondary | ICD-10-CM | POA: Diagnosis not present

## 2020-07-12 DIAGNOSIS — I63511 Cerebral infarction due to unspecified occlusion or stenosis of right middle cerebral artery: Principal | ICD-10-CM | POA: Insufficient documentation

## 2020-07-12 DIAGNOSIS — Z20822 Contact with and (suspected) exposure to covid-19: Secondary | ICD-10-CM | POA: Insufficient documentation

## 2020-07-12 DIAGNOSIS — I6601 Occlusion and stenosis of right middle cerebral artery: Secondary | ICD-10-CM | POA: Diagnosis not present

## 2020-07-12 HISTORY — DX: Essential (primary) hypertension: I10

## 2020-07-12 HISTORY — DX: Hyperlipidemia, unspecified: E78.5

## 2020-07-12 LAB — COMPREHENSIVE METABOLIC PANEL
ALT: 23 U/L (ref 0–44)
AST: 27 U/L (ref 15–41)
Albumin: 3.8 g/dL (ref 3.5–5.0)
Alkaline Phosphatase: 80 U/L (ref 38–126)
Anion gap: 4 — ABNORMAL LOW (ref 5–15)
BUN: 15 mg/dL (ref 8–23)
CO2: 25 mmol/L (ref 22–32)
Calcium: 9.1 mg/dL (ref 8.9–10.3)
Chloride: 106 mmol/L (ref 98–111)
Creatinine, Ser: 1.45 mg/dL — ABNORMAL HIGH (ref 0.61–1.24)
GFR, Estimated: 51 mL/min — ABNORMAL LOW (ref >60)
Glucose, Bld: 111 mg/dL — ABNORMAL HIGH (ref 70–99)
Potassium: 4 mmol/L (ref 3.5–5.1)
Sodium: 135 mmol/L (ref 135–145)
Total Bilirubin: 0.6 mg/dL (ref 0.3–1.2)
Total Protein: 6.9 g/dL (ref 6.5–8.1)

## 2020-07-12 LAB — I-STAT CHEM 8, ED
BUN: 17 mg/dL (ref 8–23)
Calcium, Ion: 1.17 mmol/L (ref 1.15–1.40)
Chloride: 106 mmol/L (ref 98–111)
Creatinine, Ser: 1.4 mg/dL — ABNORMAL HIGH (ref 0.61–1.24)
Glucose, Bld: 110 mg/dL — ABNORMAL HIGH (ref 70–99)
HCT: 43 % (ref 39.0–52.0)
Hemoglobin: 14.6 g/dL (ref 13.0–17.0)
Potassium: 4 mmol/L (ref 3.5–5.1)
Sodium: 139 mmol/L (ref 135–145)
TCO2: 24 mmol/L (ref 22–32)

## 2020-07-12 LAB — DIFFERENTIAL
Abs Immature Granulocytes: 0.01 10*3/uL (ref 0.00–0.07)
Basophils Absolute: 0 10*3/uL (ref 0.0–0.1)
Basophils Relative: 1 %
Eosinophils Absolute: 0.2 10*3/uL (ref 0.0–0.5)
Eosinophils Relative: 5 %
Immature Granulocytes: 0 %
Lymphocytes Relative: 35 %
Lymphs Abs: 1.6 10*3/uL (ref 0.7–4.0)
Monocytes Absolute: 0.6 10*3/uL (ref 0.1–1.0)
Monocytes Relative: 13 %
Neutro Abs: 2.2 10*3/uL (ref 1.7–7.7)
Neutrophils Relative %: 46 %

## 2020-07-12 LAB — RESP PANEL BY RT-PCR (FLU A&B, COVID) ARPGX2
Influenza A by PCR: NEGATIVE
Influenza B by PCR: NEGATIVE
SARS Coronavirus 2 by RT PCR: NEGATIVE

## 2020-07-12 LAB — ECHOCARDIOGRAM COMPLETE
AR max vel: 2.95 cm2
AV Area VTI: 2.97 cm2
AV Area mean vel: 2.93 cm2
AV Mean grad: 2 mmHg
AV Peak grad: 3.4 mmHg
Ao pk vel: 0.92 m/s
Area-P 1/2: 6.65 cm2
Height: 69 in
S' Lateral: 3.1 cm
Weight: 2640 oz

## 2020-07-12 LAB — CBC
HCT: 42.7 % (ref 39.0–52.0)
Hemoglobin: 14.3 g/dL (ref 13.0–17.0)
MCH: 30.6 pg (ref 26.0–34.0)
MCHC: 33.5 g/dL (ref 30.0–36.0)
MCV: 91.4 fL (ref 80.0–100.0)
Platelets: 217 10*3/uL (ref 150–400)
RBC: 4.67 MIL/uL (ref 4.22–5.81)
RDW: 13.5 % (ref 11.5–15.5)
WBC: 4.6 10*3/uL (ref 4.0–10.5)
nRBC: 0 % (ref 0.0–0.2)

## 2020-07-12 LAB — PROTIME-INR
INR: 1 (ref 0.8–1.2)
Prothrombin Time: 13.4 seconds (ref 11.4–15.2)

## 2020-07-12 LAB — CBG MONITORING, ED: Glucose-Capillary: 103 mg/dL — ABNORMAL HIGH (ref 70–99)

## 2020-07-12 LAB — APTT: aPTT: 29 seconds (ref 24–36)

## 2020-07-12 LAB — MRSA PCR SCREENING: MRSA by PCR: NEGATIVE

## 2020-07-12 MED ORDER — SENNOSIDES-DOCUSATE SODIUM 8.6-50 MG PO TABS: 1.0000 | ORAL_TABLET | Freq: Every evening | ORAL | Status: DC | PRN

## 2020-07-12 MED ORDER — ACETAMINOPHEN 160 MG/5ML PO SOLN: 650.0000 mg | ORAL | Status: DC | PRN

## 2020-07-12 MED ORDER — ACETAMINOPHEN 650 MG RE SUPP: 650.0000 mg | RECTAL | Status: DC | PRN

## 2020-07-12 MED ORDER — NICOTINE 21 MG/24HR TD PT24: 21.0000 mg | MEDICATED_PATCH | Freq: Every day | TRANSDERMAL | Status: DC | PRN

## 2020-07-12 MED ORDER — SODIUM CHLORIDE 0.9% FLUSH
3.0000 mL | Freq: Once | INTRAVENOUS | Status: AC
  Administered 2020-07-12: 3 mL via INTRAVENOUS

## 2020-07-12 MED ORDER — ASPIRIN 300 MG RE SUPP: 300.0000 mg | Freq: Every day | RECTAL | Status: DC

## 2020-07-12 MED ORDER — CLOPIDOGREL BISULFATE 75 MG PO TABS
75.0000 mg | ORAL_TABLET | Freq: Every day | ORAL | Status: DC
  Administered 2020-07-12 – 2020-07-13 (×2): 75 mg via ORAL
  Filled 2020-07-12 (×2): qty 1

## 2020-07-12 MED ORDER — STROKE: EARLY STAGES OF RECOVERY BOOK
Freq: Once | Status: AC
  Filled 2020-07-12 (×2): qty 1

## 2020-07-12 MED ORDER — ASPIRIN EC 325 MG PO TBEC
325.0000 mg | DELAYED_RELEASE_TABLET | Freq: Every day | ORAL | Status: DC
  Administered 2020-07-13: 325 mg via ORAL
  Filled 2020-07-12: qty 1

## 2020-07-12 MED ORDER — SODIUM CHLORIDE 0.9 % IV BOLUS
1000.0000 mL | Freq: Once | INTRAVENOUS | Status: AC
  Administered 2020-07-12: 1000 mL via INTRAVENOUS

## 2020-07-12 MED ORDER — SODIUM CHLORIDE 0.9 % IV SOLN: INTRAVENOUS | Status: DC

## 2020-07-12 MED ORDER — ENOXAPARIN SODIUM 40 MG/0.4ML IJ SOSY
40.0000 mg | PREFILLED_SYRINGE | INTRAMUSCULAR | Status: DC
  Administered 2020-07-12: 40 mg via SUBCUTANEOUS
  Filled 2020-07-12: qty 0.4

## 2020-07-12 MED ORDER — ATORVASTATIN CALCIUM 80 MG PO TABS
80.0000 mg | ORAL_TABLET | Freq: Every day | ORAL | Status: DC
  Administered 2020-07-12 – 2020-07-13 (×2): 80 mg via ORAL
  Filled 2020-07-12 (×2): qty 1

## 2020-07-12 MED ORDER — ASPIRIN 81 MG PO CHEW
81.0000 mg | CHEWABLE_TABLET | Freq: Every day | ORAL | Status: DC
  Administered 2020-07-12: 81 mg via ORAL
  Filled 2020-07-12: qty 1

## 2020-07-12 MED ORDER — ACETAMINOPHEN 325 MG PO TABS: 650.0000 mg | ORAL_TABLET | ORAL | Status: DC | PRN

## 2020-07-12 NOTE — Progress Notes (Signed)
Pt wants to keep his wallet with him at the bedside.  The contents included: $88 cash, (1) debit card.  Glasses, cell phone, clothing and shoes also at the bedside.

## 2020-07-12 NOTE — ED Triage Notes (Signed)
[place] [facility] [place] code stroke. Pt's LKW was 1125 this morning. Pt had a witnessed onset of left sided facial droop and slurred speech.

## 2020-07-12 NOTE — Consult Note (Deleted)
NEUROLOGY CONSULTATION NOTE   Date of service: Jul 12, 2020 Patient Name: Thomas Harrison MRN:  [id number] DOB:  1945-05-01 Reason for consult: "Stroke code" Requesting Provider: Tegeler, Canary Brim, * _ _ _   _ __   _ __ _ _  __ __   _ __   __ _  History of Present Illness  Thomas Harrison is a 75 y.o. male with PMH significant for smoker, Hyperlipidemia and Hypertension. who presents with  Acute onset left facial droop and aphasia.  Over the last couple of days, he has had episodes of left sided numbness and weakness in his arm and leg. Today at 1125, had a witnessed episode of L facial droop and slurred speech with difficulty findings words.  No prior hx of strokes, unsure if he has family hx of strokes. Smokes everyday. EtOh use of about a 6 pack a week. Denies any bowel or bladder incontinence, no falls, no saddle anesthesia, no lhermitte's sign.  Symptoms had resolved by the time he was brought in to the ED. CTH w/o contrast with no acute abnormality, ASPECTS of 10.   MRS: 0 NIHSS: 0 TPA/Thrombectomy: Not a candidate, no symptoms. LKW: 1125 on 07/12/20   ROS   Constitutional Denies weight loss, fever and chills.   HEENT Denies changes in vision and hearing.   Respiratory Denies SOB and cough.   CV Denies palpitations and CP   GI Denies abdominal pain, nausea, vomiting and diarrhea.   GU Denies dysuria and urinary frequency.   MSK Denies myalgia and joint pain.   Skin Denies rash and pruritus.   Neurological Denies headache and syncope.   Psychiatric Denies recent changes in mood. Denies anxiety and depression.    Past History   Past Medical History:  Diagnosis Date  . Hyperlipidemia   . Hypertension    History reviewed. No pertinent surgical history. History reviewed. No pertinent family history. Social History   Socioeconomic History  . Marital status: Single    Spouse name: Not on file  . Number of children: Not on file  . Years of education: Not on file  .  Highest education level: Not on file  Occupational History  . Not on file  Tobacco Use  . Smoking status: Current Every Day Smoker    Packs/day: 0.50    Types: Cigarettes  . Smokeless tobacco: Never Used  Substance and Sexual Activity  . Alcohol use: Yes  . Drug use: Yes    Types: Marijuana  . Sexual activity: Not on file  Other Topics Concern  . Not on file  Social History Narrative  . Not on file   Social Determinants of Health   Financial Resource Strain: Not on file  Food Insecurity: Not on file  Transportation Needs: Not on file  Physical Activity: Not on file  Stress: Not on file  Social Connections: Not on file   No Known Allergies  Medications  (Not in a hospital admission)    Vitals   Vitals:   07/12/20 1243 07/12/20 1246 07/12/20 1253  BP:  116/90   Pulse:  70   Resp:  17   Temp:  97.7 F (36.5 C)   TempSrc:  Oral   SpO2: 98% 96%   Weight:   74.8 kg  Height:   5\' 9"  (1.753 m)     Body mass index is 24.37 kg/m.  Physical Exam   General: Laying comfortably in bed; in no acute distress.  HENT: Normal oropharynx and  mucosa. Normal external appearance of ears and nose.  Neck: Supple, no pain or tenderness  CV: No JVD. No peripheral edema.  Pulmonary: Symmetric Chest rise. Normal respiratory effort.  Abdomen: Soft to touch, non-tender.  Ext: No cyanosis, edema, or deformity  Skin: No rash. Normal palpation of skin.   Musculoskeletal: Normal digits and nails by inspection. No clubbing.   Neurologic Examination  Mental status/Cognition: Alert, oriented to self, place, month and year, good attention.  Speech/language: Fluent, comprehension intact, object naming intact, repetition intact.  Cranial nerves:   CN II Pupils equal and reactive to light, no VF deficits    CN III,IV,VI EOM intact, no gaze preference or deviation, no nystagmus    CN V normal sensation in V1, V2, and V3 segments bilaterally    CN VII no asymmetry, no nasolabial fold  flattening    CN VIII normal hearing to speech    CN IX & X normal palatal elevation, no uvular deviation    CN XI 5/5 head turn and 5/5 shoulder shrug bilaterally    CN XII midline tongue protrusion    Motor:  Muscle bulk: normal, tone normal, pronator drift none tremor none Mvmt Root Nerve  Muscle Right Left Comments  SA C5/6 Ax Deltoid 5 5   EF C5/6 Mc Biceps 5 5   EE C6/7/8 Rad Triceps 5 5   WF C6/7 Med FCR     WE C7/8 PIN ECU     F Ab C8/T1 U ADM/FDI 5 5   HF L1/2/3 Fem Illopsoas 5 5   KE L2/3/4 Fem Quad 5 5   DF L4/5 D Peron Tib Ant 5 5   PF S1/2 Tibial Grc/Sol 5 5    Reflexes:  Right Left Comments  Pectoralis      Biceps (C5/6) 1 1   Brachioradialis (C5/6) 1 1    Triceps (C6/7) 1 1    Patellar (L3/4) 1 1    Achilles (S1)      Hoffman      Plantar     Jaw jerk    Sensation:  Light touch intact   Pin prick    Temperature    Vibration   Proprioception    Coordination/Complex Motor:  - Finger to Nose intact BL - Heel to shin intact BL - Rapid alternating movement are normal - Gait: deferred.  Labs   CBC:  Recent Labs  Lab 07/12/20 1219 07/12/20 1224  WBC 4.6  --   NEUTROABS 2.2  --   HGB 14.3 14.6  HCT 42.7 43.0  MCV 91.4  --   PLT 217  --     Basic Metabolic Panel:  Lab Results  Component Value Date   NA 139 07/12/2020   K 4.0 07/12/2020   CO2 28 07/17/2014   GLUCOSE 110 (H) 07/12/2020   BUN 17 07/12/2020   CREATININE 1.40 (H) 07/12/2020   CALCIUM 9.2 07/17/2014   Lipid Panel: No results found for: LDLCALC HgbA1c: No results found for: HGBA1C Urine Drug Screen: No results found for: LABOPIA, COCAINSCRNUR, LABBENZ, AMPHETMU, THCU, LABBARB  Alcohol Level No results found for: ETH  CT Head without contrast: CTH was negative for a large hypodensity concerning for a large territory infarct or hyperdensity concerning for an ICH  MR Angio head without contrast and Carotid Duplex BL: pending  MRI Brain: Pending   Impression   Thomas Harrison is a 75 y.o. male with PMH significant for HTN, HLD, smoker who presents with episode  of left sided weakness, numbness and facial droop, slurred speech. Episode resolved by the time he got to the ED. His neurologic examination is notable for no focal deficit.  Suspect that this was a TIA vs minor ischemic stroke, ? Stuttering lacune vs stenotic large vessel.  Primary Diagnosis:   TIA  Secondary Diagnosis: Essential (primary) hypertension, Hyperlipidemia.  Recommendations  Plan:   - Frequent Neuro checks per stroke unit protocol - Recommend brain imaging with MRI Brain without contrast - Recommend Vascular imaging with MRA Angio Head without contrast and US Carotid doppler - Recommend obtaining TTE - Recommend obtaining Lipid panel with LDL - Please start statin if LDL > 70 - Recommend HbA1c - Antithrombotic - Aspirin 81mg  and plavix 75mg  daily x 21 days, followed by aspirin 81mg  daily alone. - Recommend DVT ppx - SBP goal - permissive hypertension first 24 h < 220/110. Held home meds.  - Recommend Telemetry monitoring for arrythmia - Recommend bedside swallow screen prior to PO intake. - Stroke education booklet - Recommend PT/OT/SLP consult - Recommend Urine Tox screen.  ______________________________________________________________________   Thank you for the opportunity to take part in the care of this patient. If you have any further questions, please contact the neurology consultation attending.  Signed,  [facility] Pager Number _ _ _   _ __   _ __ _ _  __ __   _ __   __ _

## 2020-07-12 NOTE — Plan of Care (Signed)
I spoke to neurology on call MD Dr. Derry Lory, Terrilee Files. Patient will be admitted to neurology service and Neurology does not need [facility] consult. We will signoff.   Thanks  Dede Query, MD 1530pm 07/12/20

## 2020-07-12 NOTE — ED Notes (Signed)
Pt. Transported to MRI 

## 2020-07-12 NOTE — H&P (Signed)
NEUROLOGY CONSULTATION NOTE   Date of service: Jul 12, 2020 Patient Name: Thomas Harrison MRN:  [id number] DOB:  April 22, 1945 Reason for consult: "Stroke code" Requesting Provider: Erick Blinks, MD _ _ _   _ __   _ __ _ _  __ __   _ __   __ _  History of Present Illness   Norvil Martensen is a 75 y.o. male with PMH significant for smoker, Hyperlipidemia and Hypertension. who presents with  Acute onset left facial droop and aphasia.  Over the last couple of days, he has had episodes of left sided numbness and weakness in his arm and leg. Today at 1125, had a witnessed episode of L facial droop and slurred speech with difficulty findings words.  No prior hx of strokes, unsure if he has family hx of strokes. Smokes everyday. EtOh use of about a 6 pack a week. Denies any bowel or bladder incontinence, no falls, no saddle anesthesia, no lhermitte's sign.  Symptoms had resolved by the time he was brought in to the ED. CTH w/o contrast with no acute abnormality, ASPECTS of 10.   MRS: 0 NIHSS: 0 TPA/Thrombectomy: Not a candidate, no symptoms.  He will be a candidate for thrombectomy if he has recurrence of symptoms. LKW: 1125 on 07/12/20   ROS   Constitutional Denies weight loss, fever and chills.   HEENT Denies changes in vision and hearing.   Respiratory Denies SOB and cough.   CV Denies palpitations and CP   GI Denies abdominal pain, nausea, vomiting and diarrhea.   GU Denies dysuria and urinary frequency.   MSK Denies myalgia and joint pain.   Skin Denies rash and pruritus.   Neurological Denies headache and syncope.   Psychiatric Denies recent changes in mood. Denies anxiety and depression.    Past History   Past Medical History:  Diagnosis Date  . Hyperlipidemia   . Hypertension    History reviewed. No pertinent surgical history. History reviewed. No pertinent family history. Social History   Socioeconomic History  . Marital status: Single    Spouse name: Not on  file  . Number of children: Not on file  . Years of education: Not on file  . Highest education level: Not on file  Occupational History  . Not on file  Tobacco Use  . Smoking status: Current Every Day Smoker    Packs/day: 0.50    Types: Cigarettes  . Smokeless tobacco: Never Used  Substance and Sexual Activity  . Alcohol use: Yes  . Drug use: Yes    Types: Marijuana  . Sexual activity: Not on file  Other Topics Concern  . Not on file  Social History Narrative  . Not on file   Social Determinants of Health   Financial Resource Strain: Not on file  Food Insecurity: Not on file  Transportation Needs: Not on file  Physical Activity: Not on file  Stress: Not on file  Social Connections: Not on file   No Known Allergies  Medications  (Not in a hospital admission)    Vitals   Vitals:   07/12/20 1246 07/12/20 1253 07/12/20 1315 07/12/20 1457  BP: 116/90  (!) 132/95 (!) 142/91  Pulse: 70  70 63  Resp: 17  17 14   Temp: 97.7 F (36.5 C)     TempSrc: Oral     SpO2: 96%  96% 100%  Weight:  74.8 kg    Height:  5\' 9"  (1.753 m)  Body mass index is 24.37 kg/m.  Physical Exam   General: Laying comfortably in bed; in no acute distress.  HENT: Normal oropharynx and mucosa. Normal external appearance of ears and nose.  Neck: Supple, no pain or tenderness  CV: No JVD. No peripheral edema.  Pulmonary: Symmetric Chest rise. Normal respiratory effort.  Abdomen: Soft to touch, non-tender.  Ext: No cyanosis, edema, or deformity  Skin: No rash. Normal palpation of skin.   Musculoskeletal: Normal digits and nails by inspection. No clubbing.   Neurologic Examination  Mental status/Cognition: Alert, oriented to self, place, month and year, good attention.  Speech/language: Fluent, comprehension intact, object naming intact, repetition intact. Cranial nerves:   CN II Pupils equal and reactive to light, no VF deficits    CN III,IV,VI EOM intact, no gaze preference or  deviation, no nystagmus    CN V normal sensation in V1, V2, and V3 segments bilaterally    CN VII no asymmetry, no nasolabial fold flattening    CN VIII normal hearing to speech    CN IX & X normal palatal elevation, no uvular deviation    CN XI 5/5 head turn and 5/5 shoulder shrug bilaterally    CN XII midline tongue protrusion    Motor:  Muscle bulk: normal, tone normal, pronator drift none tremor none Mvmt Root Nerve  Muscle Right Left Comments  SA C5/6 Ax Deltoid 5 5   EF C5/6 Mc Biceps 5 5   EE C6/7/8 Rad Triceps 5 5   WF C6/7 Med FCR     WE C7/8 PIN ECU     F Ab C8/T1 U ADM/FDI 5 5   HF L1/2/3 Fem Illopsoas 5 5   KE L2/3/4 Fem Quad 5 5   DF L4/5 D Peron Tib Ant 5 5   PF S1/2 Tibial Grc/Sol 5 5    Reflexes:  Right Left Comments  Pectoralis 1 1    Biceps (C5/6) 1 1   Brachioradialis (C5/6) 1 1    Triceps (C6/7) 1 1    Patellar (L3/4)      Achilles (S1)      Hoffman      Plantar     Jaw jerk    Sensation:  Light touch Intact throughout   Pin prick    Temperature    Vibration   Proprioception    Coordination/Complex Motor:  - Finger to Nose intact BL - Heel to shin intact BL - Rapid alternating movement are normal - Gait: Deferred.  Labs   CBC:  Recent Labs  Lab 07/12/20 1219 07/12/20 1224  WBC 4.6  --   NEUTROABS 2.2  --   HGB 14.3 14.6  HCT 42.7 43.0  MCV 91.4  --   PLT 217  --     Basic Metabolic Panel:  Lab Results  Component Value Date   NA 139 07/12/2020   K 4.0 07/12/2020   CO2 25 07/12/2020   GLUCOSE 110 (H) 07/12/2020   BUN 17 07/12/2020   CREATININE 1.40 (H) 07/12/2020   CALCIUM 9.1 07/12/2020   GFRNONAA 51 (L) 07/12/2020   Lipid Panel: No results found for: LDLCALC HgbA1c: No results found for: HGBA1C Urine Drug Screen: No results found for: LABOPIA, COCAINSCRNUR, LABBENZ, AMPHETMU, THCU, LABBARB  Alcohol Level No results found for: ETH  CT Head without contrast: CTH was negative for a large hypodensity concerning for a  large territory infarct or hyperdensity concerning for an ICH  MR Angio head without contrast and  Carotid Duplex BL: 1. 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 Only minimal flow related signal is seen within the several right M2 branches. 2. Moderate/severe stenosis within a right PCA branch at the P2/P3 junction.  MRI Brain: 1. Motion degraded examination, as described. 2. Vague asymmetric diffusion weighted hyperintensity within the right basal ganglia, most notably within the right caudate body, consistent with acute infarction. 3. Otherwise unremarkable non-contrast MRI appearance of the brain for age.   Impression   Clint Biello is a 75 y.o. male with PMH significant for HTN, HLD, smoker who presents with episode of left sided weakness, numbness and facial droop, slurred speech. Episode resolved by the time he got to the ED. His neurologic examination is notable for no focal deficit.  Suspect that this was a TIA vs minor ischemic stroke, ? Stuttering lacune vs stenotic large vessel.  Primary Diagnosis:  Cerebral infarction due to embolism of  right middle cerebral artery.   Secondary Diagnosis: Essential (primary) hypertension, Hyperlipidemia.   Recommendations   Acute R MCA stroke: Plan:  - Admit to ICU for close monitoring of his symptoms. If he was to develop worsening or new deficit, will be a candidate for potential thrombectomy. - Frequent Neuro checks per stroke unit protocol - MRI Brain with vague asymmetric diffusion weighted hyperintensity within the right basal ganglia, most notably within the right caudate body, consistent with acute infarction.  - MRA with proximal M1 right middle cerebral artery. - TTE is pending. - LDL is pending.- Please start statin if LDL > 70 - Recommend HbA1c - Antithrombotic - Aspirin 81mg  daily along with plavix 75mg  daily x 21 days, then aspirin 81mg  daily alone. - Recommend DVT ppx - SBP goal - permissive  hypertension first 24 h < 220/110. Held home meds.  - Recommend Telemetry monitoring for arrythmia - Recommend bedside swallow screen prior to PO intake. - Stroke education booklet - Recommend PT/OT/SLP consult - Head of bed flat - IV fluids bolus 1L, followed by fluids at 156ml/hr for 48 hours.   HTN: - Goal SBP as above with permissive hypertension  HLD: - Started on Atorvastatin 80mg  daily  Smoking: - counseled on the importance of quitting smoking - Nicotine patch 21mg  Q24 hours PRN.  This patient is critically ill and at significant risk of neurological worsening, death and care requires constant monitoring of vital signs, hemodynamics,respiratory and cardiac monitoring, neurological assessment, discussion with family, other specialists and medical decision making of high complexity. I spent 35 minutes of neurocritical care time  in the care of  this patient. This was time spent independent of any time provided by nurse practitioner or PA.  [facility] Pager Number 07/12/2020  3:21 PM  ______________________________________________________________________   Thank you for the opportunity to take part in the care of this patient. If you have any further questions, please contact the neurology consultation attending.  Signed,  [facility] Pager Number _ _ _   _ __   _ __ _ _  __ __   _ __   __ _

## 2020-07-12 NOTE — ED Provider Notes (Addendum)
[facility] EMERGENCY DEPARTMENT Provider Note   CSN: [id number] Arrival date & time: 07/12/20  1218  An emergency department physician performed an initial assessment on this suspected stroke patient at 1221.  History Chief Complaint  Patient presents with  . Code Stroke    Thomas Harrison is a 75 y.o. male.  Pt had a witness episode of left sided facial droop and slurred speech.  Pt reports he feels back to normal.  Pt reports he felt weak in his legs.    The history is provided by the patient. No language interpreter was used.  Neurologic Problem This is a new problem. The current episode started 1 to 2 hours ago. The problem has been resolved. Pertinent negatives include no chest pain, no abdominal pain, no headaches and no shortness of breath. Nothing aggravates the symptoms. Nothing relieves the symptoms. He has tried nothing for the symptoms. The treatment provided no relief.       Past Medical History:  Diagnosis Date  . Hyperlipidemia   . Hypertension     There are no problems to display for this patient.   History reviewed. No pertinent surgical history.     History reviewed. No pertinent family history.  Social History   Tobacco Use  . Smoking status: Current Every Day Smoker    Packs/day: 0.50    Types: Cigarettes  . Smokeless tobacco: Never Used  Substance Use Topics  . Alcohol use: Yes  . Drug use: Yes    Types: Marijuana    Home Medications Prior to Admission medications   Medication Sig Start Date End Date Taking? Authorizing Provider  amoxicillin (AMOXIL) 500 MG capsule Take 1 capsule (500 mg total) by mouth 3 (three) times daily. 03/12/14   Reuben Likes, MD  amoxicillin-clavulanate (AUGMENTIN) 875-125 MG per tablet Take 1 tablet by mouth 2 (two) times daily. 03/03/14   Reuben Likes, MD  clindamycin (CLEOCIN) 150 MG capsule Take 1 capsule (150 mg total) by mouth 4 (four) times daily. 07/27/14   Linna Hoff, MD   diclofenac (CATAFLAM) 50 MG tablet Take 1 tablet (50 mg total) by mouth 3 (three) times daily. For dental pain 07/27/14   Linna Hoff, MD  HYDROcodone-acetaminophen (NORCO/VICODIN) 5-325 MG per tablet 1 to 2 tabs every 4 to 6 hours as needed for pain. 03/03/14   Reuben Likes, MD  meloxicam (MOBIC) 15 MG tablet Take 1 tablet (15 mg total) by mouth daily. 03/03/14   Reuben Likes, MD    Allergies    Patient has no known allergies.  Review of Systems   Review of Systems  Respiratory: Negative for shortness of breath.   Cardiovascular: Negative for chest pain.  Gastrointestinal: Negative for abdominal pain.  Neurological: Positive for facial asymmetry. Negative for headaches.  All other systems reviewed and are negative.   Physical Exam Updated Vital Signs BP 116/90   Pulse 70   Temp 97.7 F (36.5 C) (Oral)   Resp 17   Ht 5\' 9"  (1.753 m)   Wt 74.8 kg   SpO2 96%   BMI 24.37 kg/m   Physical Exam Vitals and nursing note reviewed.  Constitutional:      Appearance: He is well-developed.  HENT:     Head: Normocephalic and atraumatic.     Nose: Nose normal.     Mouth/Throat:     Mouth: Mucous membranes are moist.  Eyes:     Extraocular Movements: Extraocular movements intact.  Conjunctiva/sclera: Conjunctivae normal.     Pupils: Pupils are equal, round, and reactive to light.  Cardiovascular:     Rate and Rhythm: Normal rate and regular rhythm.     Heart sounds: No murmur heard.   Pulmonary:     Effort: Pulmonary effort is normal. No respiratory distress.     Breath sounds: Normal breath sounds.  Abdominal:     Palpations: Abdomen is soft.     Tenderness: There is no abdominal tenderness.  Musculoskeletal:     Cervical back: Neck supple.  Skin:    General: Skin is warm and dry.  Neurological:     Mental Status: He is alert.     ED Results / Procedures / Treatments   Labs (all labs ordered are listed, but only abnormal results are displayed) Labs Reviewed   I-STAT CHEM 8, ED - Abnormal; Notable for the following components:      Result Value   Creatinine, Ser 1.40 (*)    Glucose, Bld 110 (*)    All other components within normal limits  CBG MONITORING, ED - Abnormal; Notable for the following components:   Glucose-Capillary 103 (*)    All other components within normal limits  PROTIME-INR  APTT  CBC  DIFFERENTIAL  COMPREHENSIVE METABOLIC PANEL    EKG EKG Interpretation  Date/Time:  Saturday Jul 12 2020 12:44:33 EDT Ventricular Rate:  68 PR Interval:  180 QRS Duration: 81 QT Interval:  378 QTC Calculation: 402 R Axis:   69 Text Interpretation: Sinus rhythm Minimal ST elevation, anterior leads No prior ECG for comparison. No STEMI Confirmed by Theda Belfast ([id number]) on 07/12/2020 12:52:31 PM   Radiology CT HEAD CODE STROKE WO CONTRAST  Result Date: 07/12/2020 CLINICAL DATA:  Code stroke.  Aphasia, left-sided weakness. EXAM: CT HEAD WITHOUT CONTRAST TECHNIQUE: Contiguous axial images were obtained from the base of the skull through the vertex without intravenous contrast. COMPARISON:  No pertinent prior exams available for comparison. FINDINGS: Brain: Cerebral volume is normal. There is no acute intracranial hemorrhage. No demarcated cortical infarct. No extra-axial fluid collection. No evidence of intracranial mass. No midline shift. Vascular: No hyperdense vessel. Skull: Normal. Negative for fracture or focal lesion. Sinuses/Orbits: Visualized orbits show no acute finding. Trace bilateral ethmoid sinus mucosal thickening. ASPECTS ([place]berta Stroke Program Early CT Score) - Ganglionic level infarction (caudate, lentiform nuclei, internal capsule, insula, M1-M3 cortex): 7 - Supraganglionic infarction (M4-M6 cortex): 3 Total score (0-10 with 10 being normal): 10 These results were called by telephone at the time of interpretation on 07/12/2020 at 12:34 pm to provider Dr. Derry Lory, who verbally acknowledged these results. IMPRESSION: No  evidence of acute intracranial abnormality.  ASPECTS is 10. Electronically Signed   By: Jackey Loge DO   On: 07/12/2020 12:37    Procedures Procedures   Medications Ordered in ED Medications  aspirin chewable tablet 81 mg (81 mg Oral Given 07/12/20 1254)    Or  aspirin suppository 300 mg ( Rectal See Alternative 07/12/20 1254)  clopidogrel (PLAVIX) tablet 75 mg (75 mg Oral Given 07/12/20 1254)  sodium chloride flush (NS) 0.9 % injection 3 mL (3 mLs Intravenous Given 07/12/20 1257)    ED Course  I have reviewed the triage vital signs and the nursing notes.  Pertinent labs & imaging results that were available during my care of the patient were reviewed by me and considered in my medical decision making (see chart for details).    MDM Rules/Calculators/A&P  Pt presented as a code stroke.  Neurology evaluated pt and feels pt had a tia.  He has ordered MRI.  Neurology will admit   Final Clinical Impression(s) / ED Diagnoses Final diagnoses:  TIA (transient ischemic attack)    Rx / DC Orders ED Discharge Orders    None       Osie Cheeks 07/12/20 1323    Elson Areas, [place] 07/12/20 1451    Tegeler, Canary Brim, MD 07/13/20 2038

## 2020-07-12 NOTE — Progress Notes (Signed)
  Echocardiogram 2D Echocardiogram has been performed.  Thomas Harrison F 07/12/2020, 5:52 PM

## 2020-07-12 NOTE — Plan of Care (Signed)
  Problem: Nutrition: Goal: Adequate nutrition will be maintained Outcome: Progressing   

## 2020-07-13 ENCOUNTER — Inpatient Hospital Stay (HOSPITAL_BASED_OUTPATIENT_CLINIC_OR_DEPARTMENT_OTHER): Payer: Medicare Other

## 2020-07-13 DIAGNOSIS — I63511 Cerebral infarction due to unspecified occlusion or stenosis of right middle cerebral artery: Secondary | ICD-10-CM

## 2020-07-13 DIAGNOSIS — I6601 Occlusion and stenosis of right middle cerebral artery: Secondary | ICD-10-CM | POA: Diagnosis not present

## 2020-07-13 DIAGNOSIS — F17200 Nicotine dependence, unspecified, uncomplicated: Secondary | ICD-10-CM | POA: Diagnosis not present

## 2020-07-13 LAB — HEMOGLOBIN A1C
Hgb A1c MFr Bld: 5.5 % (ref 4.8–5.6)
Mean Plasma Glucose: 111.15 mg/dL

## 2020-07-13 LAB — RAPID URINE DRUG SCREEN, HOSP PERFORMED
Amphetamines: NOT DETECTED
Barbiturates: NOT DETECTED
Benzodiazepines: NOT DETECTED
Cocaine: NOT DETECTED
Opiates: NOT DETECTED
Tetrahydrocannabinol: POSITIVE — AB

## 2020-07-13 LAB — LIPID PANEL
Cholesterol: 172 mg/dL (ref 0–200)
HDL: 32 mg/dL — ABNORMAL LOW (ref >40)
LDL Cholesterol: 117 mg/dL — ABNORMAL HIGH (ref 0–99)
Total CHOL/HDL Ratio: 5.4 RATIO
Triglycerides: 117 mg/dL (ref <150)
VLDL: 23 mg/dL (ref 0–40)

## 2020-07-13 MED ORDER — ATORVASTATIN CALCIUM 80 MG PO TABS: 80.0000 mg | ORAL_TABLET | Freq: Every day | ORAL | 3 refills | Status: AC

## 2020-07-13 MED ORDER — ASPIRIN 325 MG PO TBEC: 325.0000 mg | DELAYED_RELEASE_TABLET | Freq: Every day | ORAL | 0 refills | Status: AC

## 2020-07-13 MED ORDER — CHLORHEXIDINE GLUCONATE CLOTH 2 % EX PADS: 6.0000 | MEDICATED_PAD | Freq: Every day | CUTANEOUS | Status: DC

## 2020-07-13 MED ORDER — NICOTINE 21 MG/24HR TD PT24: 21.0000 mg | MEDICATED_PATCH | Freq: Every day | TRANSDERMAL | 0 refills | Status: AC | PRN

## 2020-07-13 MED ORDER — CLOPIDOGREL BISULFATE 75 MG PO TABS: 75.0000 mg | ORAL_TABLET | Freq: Every day | ORAL | 0 refills | Status: DC

## 2020-07-13 NOTE — Evaluation (Signed)
Physical Therapy Evaluation and Discharge Patient Details Name: Thomas Harrison MRN: [id number] DOB: 1945/09/23 Today's Date: 07/13/2020   History of Present Illness  75 y.o. male who presented 07/12/20 with  Acute onset left facial droop and aphasia. CTH w/o contrast with no acute abnormality; Occlusion of the proximal M1 right middle cerebral artery, Rt basal ganglia infarct  PMH significant for smoker, Hyperlipidemia and Hypertension.  Clinical Impression   Patient evaluated by Physical Therapy with no further acute PT needs identified. All education has been completed and the patient has no further questions. 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 He ambulated 200 ft with small drift when asked to look down, but otherwise gait WNL.  PT is signing off. Thank you for this referral.     Follow Up Recommendations No PT follow up    Equipment Recommendations  None recommended by PT    Recommendations for Other Services OT consult     Precautions / Restrictions Precautions Precautions: Other (comment) Precaution Comments: BP <220/110      Mobility  Bed Mobility Overal bed mobility: Needs Assistance Bed Mobility: Supine to Sit     Supine to sit: Min assist     General bed mobility comments: on Icu bed with air mattress, required min assist to prevent loss of balance back down onto elbow    Transfers Overall transfer level: Independent Equipment used: None             General transfer comment: no imbalance or symptoms  Ambulation/Gait Ambulation/Gait assistance: Independent Gait Distance (Feet): 200 Feet Assistive device: None Gait Pattern/deviations: WFL(Within Functional Limits)     General Gait Details: slower than his usual per pt; able to incr velocity and decr without issues; head turns  Information systems manager Rankin (Stroke Patients Only) Modified Rankin (Stroke Patients Only) Pre-Morbid Rankin Score: No  symptoms Modified Rankin: No symptoms     Balance Overall balance assessment: Independent                               Standardized Balance Assessment Standardized Balance Assessment : Dynamic Gait Index   Dynamic Gait Index Level Surface: Normal Change in Gait Speed: Normal Gait with Horizontal Head Turns: Normal Gait with Vertical Head Turns: Mild Impairment Gait and Pivot Turn: Normal       Pertinent Vitals/Pain Pain Assessment: No/denies pain    Home Living Family/patient expects to be discharged to:: Private residence Living Arrangements: Other relatives (sister) Available Help at Discharge: Family;Available PRN/intermittently Type of Home: House Home Access: Stairs to enter Entrance Stairs-Rails: Doctor, general practice of Steps: 5 Home Layout: One level Home Equipment: None      Prior Function Level of Independence: Independent         Comments: works odd Forensic scientist        Extremity/Trunk Assessment   Upper Extremity Assessment Upper Extremity Assessment: Defer to OT evaluation    Lower Extremity Assessment Lower Extremity Assessment: Overall WFL for tasks assessed    Cervical / Trunk Assessment Cervical / Trunk Assessment: Normal  Communication   Communication: No difficulties  Cognition Arousal/Alertness: Awake/alert Behavior During Therapy: WFL for tasks assessed/performed Overall Cognitive Status: Within Functional Limits for tasks assessed  General Comments      Exercises     Assessment/Plan    PT Assessment Patent does not need any further PT services  PT Problem List         PT Treatment Interventions      PT Goals (Current goals can be found in the Care Plan section)  Acute Rehab PT Goals Patient Stated Goal: return home PT Goal Formulation: All assessment and education complete, DC therapy    Frequency      Barriers to discharge        Co-evaluation               AM-PAC PT "6 Clicks" Mobility  Outcome Measure Help needed turning from your back to your side while in a flat bed without using bedrails?: None Help needed moving from lying on your back to sitting on the side of a flat bed without using bedrails?: A Little Help needed moving to and from a bed to a chair (including a wheelchair)?: None Help needed standing up from a chair using your arms (e.g., wheelchair or bedside chair)?: None Help needed to walk in hospital room?: None Help needed climbing 3-5 steps with a railing? : None 6 Click Score: 23    End of Session Equipment Utilized During Treatment: Gait belt Activity Tolerance: Patient tolerated treatment well Patient left: in chair;with call bell/phone within reach (no chair alarm pads on unit; RN aware and agrees OK for up to chair (fall risk=6)) Nurse Communication: Mobility status;Other (comment) (no PT needs) PT Visit Diagnosis: Difficulty in walking, not elsewhere classified (R26.2)    Time: 5284-1324 PT Time Calculation (min) (ACUTE ONLY): 23 min   Charges:   PT Evaluation $PT Eval Moderate Complexity: 1 Mod           Jerolyn Center, PT Pager [phone]   Zena Amos 07/13/2020, 9:46 AM

## 2020-07-13 NOTE — Discharge Summary (Signed)
Stroke Discharge Summary  Patient ID: Thomas FavreJoseph Harrison    l   MRN: [id number]      DOB: 1945-10-30  Date of Admission: 07/12/2020 Date of Discharge: 07/13/2020  Attending Physician:  Stroke, Md, MD, Stroke MD Consultant(s):    None  Patient's PCP:  Thomas Harrison, Veita, MD  DISCHARGE DIAGNOSIS:  Active Problems:   Acute right MCA stroke [facility](HCC)  Secondary diagnosis  Right MCA occlusion  Smoker   HTN  HLD  Allergies as of 07/13/2020   No Known Allergies     Medication List    STOP taking these medications   amoxicillin 500 MG capsule Commonly known as: AMOXIL   amoxicillin-clavulanate 875-125 MG tablet Commonly known as: Augmentin   clindamycin 150 MG capsule Commonly known as: CLEOCIN   diclofenac 50 MG tablet Commonly known as: CATAFLAM   HYDROcodone-acetaminophen 5-325 MG tablet Commonly known as: NORCO/VICODIN   meloxicam 15 MG tablet Commonly known as: MOBIC   rosuvastatin 10 MG tablet Commonly known as: CRESTOR     TAKE these medications   aspirin 325 MG EC tablet Take 1 tablet (325 mg total) by mouth daily. Start taking on: Jul 14, 2020   atorvastatin 80 MG tablet Commonly known as: LIPITOR Take 1 tablet (80 mg total) by mouth daily. Start taking on: Jul 14, 2020   clopidogrel 75 MG tablet Commonly known as: PLAVIX Take 1 tablet (75 mg total) by mouth daily. Start taking on: Jul 14, 2020   nicotine 21 mg/24hr patch Commonly known as: NICODERM CQ - dosed in mg/24 hours Place 1 patch (21 mg total) onto the skin daily as needed (assist with smoking cessation).   pantoprazole 20 MG tablet Commonly known as: PROTONIX Take 20 mg by mouth daily.   tamsulosin 0.4 MG Caps capsule Commonly known as: FLOMAX Take 0.4 mg by mouth daily.   Vitamin D3 25 MCG (1000 UT) Caps Take 1 capsule by mouth daily. What changed: Another medication with the same name was removed. Continue taking this medication, and follow the directions you see here.       LABORATORY  STUDIES CBC    Component Value Date/Time   WBC 4.6 07/12/2020 1219   RBC 4.67 07/12/2020 1219   HGB 14.6 07/12/2020 1224   HCT 43.0 07/12/2020 1224   PLT 217 07/12/2020 1219   MCV 91.4 07/12/2020 1219   MCH 30.6 07/12/2020 1219   MCHC 33.5 07/12/2020 1219   RDW 13.5 07/12/2020 1219   LYMPHSABS 1.6 07/12/2020 1219   MONOABS 0.6 07/12/2020 1219   EOSABS 0.2 07/12/2020 1219   BASOSABS 0.0 07/12/2020 1219   CMP    Component Value Date/Time   NA 139 07/12/2020 1224   K 4.0 07/12/2020 1224   CL 106 07/12/2020 1224   CO2 25 07/12/2020 1219   GLUCOSE 110 (H) 07/12/2020 1224   BUN 17 07/12/2020 1224   CREATININE 1.40 (H) 07/12/2020 1224   CREATININE 1.04 07/17/2014 1009   CALCIUM 9.1 07/12/2020 1219   PROT 6.9 07/12/2020 1219   ALBUMIN 3.8 07/12/2020 1219   AST 27 07/12/2020 1219   ALT 23 07/12/2020 1219   ALKPHOS 80 07/12/2020 1219   BILITOT 0.6 07/12/2020 1219   GFRNONAA 51 (L) 07/12/2020 1219   COAGS Lab Results  Component Value Date   INR 1.0 07/12/2020   INR 0.98 07/17/2014   Lipid Panel    Component Value Date/Time   CHOL 172 07/13/2020 0048   TRIG 117 07/13/2020 0048   HDL 32 (  L) 07/13/2020 0048   CHOLHDL 5.4 07/13/2020 0048   VLDL 23 07/13/2020 0048   LDLCALC 117 (H) 07/13/2020 0048   HgbA1C  Lab Results  Component Value Date   HGBA1C 5.5 07/13/2020   Urinalysis No results found for: COLORURINE, APPEARANCEUR, LABSPEC, PHURINE, GLUCOSEU, HGBUR, BILIRUBINUR, KETONESUR, PROTEINUR, UROBILINOGEN, NITRITE, LEUKOCYTESUR Urine Drug Screen     Component Value Date/Time   LABOPIA NONE DETECTED 07/13/2020 0106   COCAINSCRNUR NONE DETECTED 07/13/2020 0106   LABBENZ NONE DETECTED 07/13/2020 0106   AMPHETMU NONE DETECTED 07/13/2020 0106   THCU POSITIVE (A) 07/13/2020 0106   LABBARB NONE DETECTED 07/13/2020 0106    Alcohol Level No results found for: ETH   SIGNIFICANT DIAGNOSTIC STUDIES MR ANGIO HEAD WO CONTRAST  Result Date: 07/12/2020 CLINICAL DATA:   Neuro deficit, acute, stroke suspected. Aphasia, left-sided weakness. EXAM: MRI HEAD WITHOUT CONTRAST MRA HEAD WITHOUT CONTRAST TECHNIQUE: Multiplanar, multi-echo pulse sequences of the brain and surrounding structures were acquired without intravenous contrast. Angiographic images of the Circle of Willis were acquired using MRA technique without intravenous contrast. COMPARISON:  Noncontrast head CT performed earlier today. FINDINGS: MRI HEAD FINDINGS Brain: Intermittently motion degraded examination. Most notably, there is moderate motion degradation of the axial SWI sequence, moderate motion degradation of the axial T1 weighted sequence and severe motion degradation of the coronal T2 weighted sequence. Cerebral volume is normal. There is vague asymmetric diffusion weighted hyperintensity within the right basal ganglia, most notably within the right caudate body, consistent with acute infarction (for instance as seen on series 5, image 80). No cortical encephalomalacia is identified. No significant white matter disease for age. No evidence of intracranial mass. No appreciable chronic intracranial blood products. No extra-axial fluid collection. No midline shift. Vascular: Reported below. Skull and upper cervical spine: No focal marrow lesion. Incompletely assessed cervical spondylosis. Sinuses/Orbits: Visualized orbits show no acute finding. Trace bilateral ethmoid sinus mucosal thickening. MRA HEAD FINDINGS Anterior circulation: The intracranial internal carotid arteries are patent. The proximal M1 right middle cerebral artery is occluded shortly beyond its origin. Minimal flow related signal is seen within right M2 branches, markedly diminished as compared to the left. The left M1 middle cerebral artery is patent. No left M2 proximal branch occlusion or high-grade proximal stenosis. The anterior cerebral arteries are patent. No intracranial aneurysm is identified. Posterior circulation: The intracranial  vertebral arteries are patent. The basilar artery is patent. The posterior cerebral arteries are patent. Moderate/severe stenosis within a right PCA branch at the P2/P3 junction. Posterior communicating arteries are hypoplastic or absent bilaterally. Anatomic variants: As described. These results were called by telephone at the time of interpretation on 07/12/2020 at 2:36 pm to provider [facility] , who verbally acknowledged these results. IMPRESSION: MRI brain: 1. Motion degraded examination, as described. 2. Vague asymmetric diffusion weighted hyperintensity within the right basal ganglia, most notably within the right caudate body, consistent with acute infarction. 3. Otherwise unremarkable non-contrast MRI appearance of the brain for age. MRA head: 1. 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 Only minimal flow related signal is seen within the several right M2 branches. 2. Moderate/severe stenosis within a right PCA branch at the P2/P3 junction. Electronically Signed   By: Jackey Loge DO   On: 07/12/2020 14:36   MR BRAIN WO CONTRAST  Result Date: 07/12/2020 CLINICAL DATA:  Neuro deficit, acute, stroke suspected. Aphasia, left-sided weakness. EXAM: MRI HEAD WITHOUT CONTRAST MRA HEAD WITHOUT CONTRAST TECHNIQUE: Multiplanar, multi-echo pulse sequences of the brain and surrounding  structures were acquired without intravenous contrast. Angiographic images of the Circle of Willis were acquired using MRA technique without intravenous contrast. COMPARISON:  Noncontrast head CT performed earlier today. FINDINGS: MRI HEAD FINDINGS Brain: Intermittently motion degraded examination. Most notably, there is moderate motion degradation of the axial SWI sequence, moderate motion degradation of the axial T1 weighted sequence and severe motion degradation of the coronal T2 weighted sequence. Cerebral volume is normal. There is vague asymmetric diffusion weighted hyperintensity within the right basal  ganglia, most notably within the right caudate body, consistent with acute infarction (for instance as seen on series 5, image 80). No cortical encephalomalacia is identified. No significant white matter disease for age. No evidence of intracranial mass. No appreciable chronic intracranial blood products. No extra-axial fluid collection. No midline shift. Vascular: Reported below. Skull and upper cervical spine: No focal marrow lesion. Incompletely assessed cervical spondylosis. Sinuses/Orbits: Visualized orbits show no acute finding. Trace bilateral ethmoid sinus mucosal thickening. MRA HEAD FINDINGS Anterior circulation: The intracranial internal carotid arteries are patent. The proximal M1 right middle cerebral artery is occluded shortly beyond its origin. Minimal flow related signal is seen within right M2 branches, markedly diminished as compared to the left. The left M1 middle cerebral artery is patent. No left M2 proximal branch occlusion or high-grade proximal stenosis. The anterior cerebral arteries are patent. No intracranial aneurysm is identified. Posterior circulation: The intracranial vertebral arteries are patent. The basilar artery is patent. The posterior cerebral arteries are patent. Moderate/severe stenosis within a right PCA branch at the P2/P3 junction. Posterior communicating arteries are hypoplastic or absent bilaterally. Anatomic variants: As described. These results were called by telephone at the time of interpretation on 07/12/2020 at 2:36 pm to provider [facility] , who verbally acknowledged these results. IMPRESSION: MRI brain: 1. Motion degraded examination, as described. 2. Vague asymmetric diffusion weighted hyperintensity within the right basal ganglia, most notably within the right caudate body, consistent with acute infarction. 3. Otherwise unremarkable non-contrast MRI appearance of the brain for age. MRA head: 1. 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  Only minimal flow related signal is seen within the several right M2 branches. 2. Moderate/severe stenosis within a right PCA branch at the P2/P3 junction. Electronically Signed   By: Jackey Loge DO   On: 07/12/2020 14:36   ECHOCARDIOGRAM COMPLETE  Result Date: 07/12/2020    ECHOCARDIOGRAM REPORT   Patient Name:   Thomas Harrison Date of Exam: 07/12/2020 Medical Rec #:  [id number]     Height:       69.0 in Accession #:    [id number]    Weight:       165.0 lb Date of Birth:  Mar 19, 1945     BSA:          1.904 m Patient Age:    75 years      BP:           144/87 mmHg Patient Gender: M             HR:           59 bpm. Exam Location:  Inpatient Procedure: 2D Echo, Cardiac Doppler and Color Doppler Indications:    Stroke I63.9  History:        Patient has no prior history of Echocardiogram examinations.  Sonographer:    Roosvelt Maser RDCS Referring Phys: [id number] [facility] IMPRESSIONS  1. Left ventricular ejection fraction, by estimation, is 60 to 65%. The left ventricle has normal function.  The left ventricle has no regional wall motion abnormalities. Left ventricular diastolic parameters are consistent with Grade I diastolic dysfunction (impaired relaxation).  2. Right ventricular systolic function is normal. The right ventricular size is normal. Tricuspid regurgitation signal is inadequate for assessing PA pressure.  3. The mitral valve is normal in structure. Trivial mitral valve regurgitation. No evidence of mitral stenosis.  4. The aortic valve is tricuspid. Aortic valve regurgitation is not visualized. Mild aortic valve sclerosis is present, with no evidence of aortic valve stenosis.  5. The inferior vena cava is normal in size with greater than 50% respiratory variability, suggesting right atrial pressure of 3 mmHg. FINDINGS  Left Ventricle: Left ventricular ejection fraction, by estimation, is 60 to 65%. The left ventricle has normal function. The left ventricle has no regional wall motion abnormalities.  The left ventricular internal cavity size was normal in size. There is  no left ventricular hypertrophy. Left ventricular diastolic parameters are consistent with Grade I diastolic dysfunction (impaired relaxation). Normal left ventricular filling pressure. Right Ventricle: The right ventricular size is normal. No increase in right ventricular wall thickness. Right ventricular systolic function is normal. Tricuspid regurgitation signal is inadequate for assessing PA pressure. Left Atrium: Left atrial size was normal in size. Right Atrium: Right atrial size was normal in size. Pericardium: There is no evidence of pericardial effusion. Mitral Valve: The mitral valve is normal in structure. Trivial mitral valve regurgitation. No evidence of mitral valve stenosis. Tricuspid Valve: The tricuspid valve is normal in structure. Tricuspid valve regurgitation is not demonstrated. No evidence of tricuspid stenosis. Aortic Valve: The aortic valve is tricuspid. Aortic valve regurgitation is not visualized. Mild aortic valve sclerosis is present, with no evidence of aortic valve stenosis. Aortic valve mean gradient measures 2.0 mmHg. Aortic valve peak gradient measures 3.4 mmHg. Aortic valve area, by VTI measures 2.97 cm. Pulmonic Valve: The pulmonic valve was normal in structure. Pulmonic valve regurgitation is not visualized. No evidence of pulmonic stenosis. Aorta: The aortic root is normal in size and structure. Venous: The inferior vena cava is normal in size with greater than 50% respiratory variability, suggesting right atrial pressure of 3 mmHg. IAS/Shunts: No atrial level shunt detected by color flow Doppler.  LEFT VENTRICLE PLAX 2D LVIDd:         3.80 cm  Diastology LVIDs:         3.10 cm  LV e' medial:    7.83 cm/s LV PW:         0.90 cm  LV E/e' medial:  9.7 LV IVS:        0.60 cm  LV e' lateral:   8.27 cm/s LVOT diam:     2.20 cm  LV E/e' lateral: 9.2 LV SV:         56 LV SV Index:   29 LVOT Area:     3.80 cm   RIGHT VENTRICLE          IVC RV Basal diam:  2.70 cm  IVC diam: 1.30 cm LEFT ATRIUM             Index       RIGHT ATRIUM           Index LA diam:        2.30 cm 1.21 cm/m  RA Area:     11.40 cm LA Vol (A2C):   46.1 ml 24.21 ml/m RA Volume:   22.40 ml  11.77 ml/m LA Vol (A4C):   39.4  ml 20.70 ml/m LA Biplane Vol: 45.6 ml 23.95 ml/m  AORTIC VALVE AV Area (Vmax):    2.95 cm AV Area (Vmean):   2.93 cm AV Area (VTI):     2.97 cm AV Vmax:           91.60 cm/s AV Vmean:          61.100 cm/s AV VTI:            0.188 m AV Peak Grad:      3.4 mmHg AV Mean Grad:      2.0 mmHg LVOT Vmax:         71.10 cm/s LVOT Vmean:        47.100 cm/s LVOT VTI:          0.147 m LVOT/AV VTI ratio: 0.78  AORTA Ao Root diam: 3.10 cm MITRAL VALVE MV Area (PHT): 6.65 cm    SHUNTS MV Decel Time: 114 msec    Systemic VTI:  0.15 m MV E velocity: 75.80 cm/s  Systemic Diam: 2.20 cm MV A velocity: 85.30 cm/s MV E/A ratio:  0.89 Armanda Magic MD Electronically signed by Armanda Magic MD Signature Date/Time: 07/12/2020/6:29:18 PM    Final    CT HEAD CODE STROKE WO CONTRAST  Result Date: 07/12/2020 CLINICAL DATA:  Code stroke.  Aphasia, left-sided weakness. EXAM: CT HEAD WITHOUT CONTRAST TECHNIQUE: Contiguous axial images were obtained from the base of the skull through the vertex without intravenous contrast. COMPARISON:  No pertinent prior exams available for comparison. FINDINGS: Brain: Cerebral volume is normal. There is no acute intracranial hemorrhage. No demarcated cortical infarct. No extra-axial fluid collection. No evidence of intracranial mass. No midline shift. Vascular: No hyperdense vessel. Skull: Normal. Negative for fracture or focal lesion. Sinuses/Orbits: Visualized orbits show no acute finding. Trace bilateral ethmoid sinus mucosal thickening. ASPECTS ([place]ber[place] Stroke Program Early CT Score) - Ganglionic level infarction (caudate, lentiform nuclei, internal capsule, insula, M1-M3 cortex): 7 - Supraganglionic infarction (M4-M6  cortex): 3 Total score (0-10 with 10 being normal): 10 These results were called by telephone at the time of interpretation on 07/12/2020 at 12:34 pm to provider Dr. Derry Lory, who verbally acknowledged these results. IMPRESSION: No evidence of acute intracranial abnormality.  ASPECTS is 10. Electronically Signed   By: Jackey Loge DO   On: 07/12/2020 12:37      HISTORY OF PRESENT ILLNESS Thomas Harrison a 75 y.o.malewith PMH significant for smoker,Hyperlipidemia and Hypertension.who presents with Acute onset left facial droop and aphasia.  Over the last couple of days, he has had episodes of left sided numbness and weakness in his arm and leg. Today at 1125, had a witnessed episode of L facial droop and slurred speech with difficulty findings words.  No prior hx of strokes, unsure if he has family hx of strokes. Smokes everyday. EtOh use of about a 6 pack a week. Denies any bowel or bladder incontinence, no falls, no saddle anesthesia, no lhermitte's sign.  Symptoms had resolved by the time he was brought in to the ED. CTH w/o contrast with no acute abnormality, ASPECTS of 10.   MRS: 0 NIHSS: 0 TPA/Thrombectomy: Not a candidate, no symptoms.  He will be a candidate for thrombectomy if he has recurrence of symptoms. LKW: 1125 on 07/12/20  HOSPITAL COURSE  Thomas Harrison is a 75 y.o. male with history of HTN, HLD, smoker admitted for left facial droop, left sided weakness and dysarthria. No tPA given due to symptom resolved.    Stroke:  right PLIC infarct secondary to large vessel disease source with right M1 occlusion  CT head no acute abnormality  MRI  vage DWI signal at right PLIC, more likely due to infarct  MRA  Right proximal M1 occlusion  Carotid Doppler negative  2D Echo  EF 60-65%  LDL 117  HgbA1c 5.5  UDS + THC  lovenox for VTE prophylaxis  No antithrombotic prior to admission, now on aspirin 325 mg daily and clopidogrel 75 mg daily DAPT for 3 months  and then ASA alone.  Patient counseled to be compliant with his antithrombotic medications  Ongoing aggressive stroke risk factor management  Therapy recommendations:  none   Disposition:  Home today  Hypertension . Stable  Long term BP goal 130-150 given right M1 occlusion  Hyperlipidemia  Home meds:  crestor 10   LDL 117, goal < 70  Now on lipitor 80  Continue statin at discharge  Tobacco abuse  Current smoker  Smoking cessation counseling provided  Nicotine patch provided  Pt is willing to quit  Other Stroke Risk Factors  Advanced age  UDS positive for [facility] - cessation education provided.  Other Active Problems  CKD II, cre 1.40   DISCHARGE EXAM  Temp:  [97.5 F (36.4 C)-98.4 F (36.9 C)] 98.3 F (36.8 C) (05/15 0800) Pulse Rate:  [52-85] 63 (05/15 1100) Resp:  [11-19] 18 (05/15 0800) BP: (106-148)/(74-120) 136/89 (05/15 1100) SpO2:  [93 %-100 %] 97 % (05/15 1100) Weight:  [74.8 kg-75 kg] 75 kg (05/14 1600)  General - Well nourished, well developed, in no apparent distress.  Ophthalmologic - fundi not visualized due to noncooperation.  Cardiovascular - Regular rhythm and rate.  Mental Status -  Level of arousal and orientation to time, place, and person were intact. Language including expression, naming, repetition, comprehension was assessed and found intact. Fund of Knowledge was assessed and was intact.  Cranial Nerves II - XII - II - Visual field intact OU. III, IV, VI - Extraocular movements intact. V - Facial sensation intact bilaterally. VII - slight left nasolabial fold flattening VIII - Hearing & vestibular intact bilaterally. X - Palate elevates symmetrically. XI - Chin turning & shoulder shrug intact bilaterally. XII - Tongue protrusion intact.  Motor Strength - The patient's strength was normal in all extremities and pronator drift was absent.  Bulk was normal and fasciculations were absent.   Motor Tone - Muscle tone was  assessed at the neck and appendages and was normal.  Reflexes - The patient's reflexes were symmetrical in all extremities and he had no pathological reflexes.  Sensory - Light touch, temperature/pinprick were assessed and were symmetrical.    Coordination - The patient had normal movements in the hands with no ataxia or dysmetria.  Tremor was absent.  Gait and Station - deferred.   Discharge Diet       Diet   Diet regular Room service appropriate? Yes with Assist; Fluid consistency: Thin   liquids  DISCHARGE PLAN  Disposition:  home  aspirin 325 mg daily and clopidogrel 75 mg daily for secondary stroke prevention for 3 months then ASA alone.  Ongoing stroke risk factor control by Primary Care Physician at time of discharge  Follow-up PCP Thomas Rakers, MD in 2 weeks.  Follow-up in [facility] in 4 weeks, office to schedule an appointment.   DC instructions You were admitted for small stroke at right side brain due to right side brain large vessel called right MCA was  occluded. We monitored you closely in the hospital and you were doing well. However, you need continue to take care of yourself after discharge. Take the medication as prescribed, asprin  daily and lipitor  daily. Another medication called plavix daily for 3 months. Check BP at home, ideally at 130-150 given your right MCA is occluded. Contact your primary care doctor if BP constantly not at goal. Quit smoking is very important to use. Limit alcohol to less than one drink per day. You will be called to follow up with neurology at [facility] clinic. Call 911 if any new stroke like symptoms.   35 minutes were spent preparing discharge.  Marvel Plan, MD PhD Stroke Neurology 07/13/2020 11:42 AM

## 2020-07-13 NOTE — Discharge Instructions (Signed)
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 We monitored you closely in the hospital and you were doing well. However, you need continue to take care of yourself after discharge. Take the medication as prescribed, asprin  daily and lipitor  daily. Another medication called plavix daily for 3 months. Check BP at home, ideally at 130-150 given your right MCA is occluded. Contact your primary care doctor if BP constantly not at goal. Quit smoking is very important to use. Limit alcohol to less than one drink per day. You will be called to follow up with neurology at [facility] clinic. Call 911 if any new stroke like symptoms.      Ischemic Stroke  An ischemic stroke is the sudden death of brain tissue. This type of stroke happens when part of the brain does not get enough blood. This can cause lifelong change in how the brain works. This change can cause problems in other parts of the body. This condition is an emergency. It must be treated right away. What are the causes? This condition is caused by lower blood flow to part of the brain. This may be due to:  A small clump, or clot, of blood.  A buildup of fatty substance (plaque) in the blood vessels.  An abnormal heart rhythm.  A blocked or damaged artery in the head or neck. Arteries are blood vessels that move blood away from the heart.  Infection.  Swelling of the arteries in the brain. What increases the risk? Things that you can change  Other medical problems, such as: ? High blood pressure (hypertension). ? Heart disease. ? Diabetes. ? High cholesterol. ? Being very overweight (obese). ? Paused or stopped breathing during sleep (sleep apnea). ? Migraine headache.  Smoking or other tobacco use.  Not being active.  Heavy alcohol use.  Using drugs.  Taking birth control pills. Things that you cannot change  Being older than age  82.  Having had blood clots, stroke, or mini-stroke (transient ischemic attack, TIA) before.  High blood pressure when you are pregnant, in women.  Stroke in your family.  Sickle cell disease.  Disorders that affect how blood clots. What are the signs or symptoms? Symptoms of a stroke normally happen all of a sudden. They can include:  Weakness or loss of feeling in your face, arm, or leg, often on one side of the body.  Loss of balance.  Loss of controlled, correct movement of your body parts (coordination).  Slurred speech.  Trouble talking or trouble understanding what people say.  Problems with not seeing things correctly.  Feeling dizzy or confused.  Feeling like you may vomit (nauseous) and vomiting.  A very bad headache for no reason. If you can, write down the exact time that you last felt normal and what time you first had symptoms. Tell your doctor. If symptoms come and go, they could be caused by a mini-stroke. Get help right away, even if you feel better. How is this treated? You must get treatment as soon as you have stroke symptoms. Some treatments work better if they are done within 3-6 hours of your first symptoms. You may get medicines that:  Take out or break up the blood clot.  Control blood pressure.  Thin your blood. Other treatments may include:  Treatment for breathing.  Fluids through an IV tube.  Procedures that make blood flow better. You may need  to manage your risk of stroke with medicines and diet changes. After a stroke, you may get therapy to help you get better. Follow these instructions at home: Medicines  Take over-the-counter and prescription medicines only as told by your doctor.  If you were told to take aspirin or another medicine to thin your blood, take it exactly as told. Take it at the same time each day. ? Taking too much of the medicine can cause bleeding. ? If you do not take enough medicine, it may not work as  well.  Know the side effects of your medicines. If you are taking a blood thinner, make sure you: ? Hold pressure over any cuts for longer than normal. ? Tell your dentist and other doctors that you take this medicine. ? Avoid activities that could hurt or bruise you. Eating and drinking  Follow instructions from your doctor about diet.  Eat healthy foods.  If you have trouble swallowing: ? Take small bites when eating. ? Eat foods that are soft or pureed. Safety  Follow instructions from your care team about physical activity.  Use a walker or cane as told by your doctor.  Keep your home safe so you do not fall. You may need to: ? Have experts look at your home to make sure it is safe. ? Put grab bars in the bedroom and bathroom. ? Use raised toilets. ? Put a seat in the shower. General instructions  Do not use any products that contain nicotine or tobacco, such as cigarettes, e-cigarettes, and chewing tobacco. If you need help quitting, ask your doctor.  If you drink alcohol: ? Limit how much you use to:  0-1 drink a day for women.  0-2 drinks a day for men. ? Be aware of how much alcohol is in your drink. In the [place] one drink equals one 12 oz bottle of beer (355 mL), one 5 oz glass of wine (148 mL), or one 1 oz glass of hard liquor (44 mL).  If you need help to stop using drugs or alcohol, ask your doctor to refer you to a program or specialist.  Stay active. Exercise as told.  Wear a medical bracelet as told by your doctor.  Keep all follow-up visits as told by your doctor. Go to visits with all specialists on your care team. This is important. How is this prevented? You can lower your risk of another stroke if you:  Manage high blood pressure, high cholesterol, diabetes, heart disease, sleep problems, and weight.  Quit smoking, limit alcohol, and stay active. Work with your doctor to care for yourself after a stroke. This may keep you from getting more  problems. Get help right away if: You have any signs of a stroke. "BE FAST" is an easy way to remember the main warning signs:  B - Balance. Signs are dizziness, sudden trouble walking, or loss of balance.  E - Eyes. Signs are trouble seeing or a change in how you see.  F - Face. Signs are sudden weakness or loss of feeling of the face, or the face or eyelid drooping on one side.  A - Arms. Signs are weakness or loss of feeling in an arm. This happens suddenly and usually on one side of the body.  S - Speech. Signs are sudden trouble speaking, slurred speech, or trouble understanding what people say.  T - Time. Time to call emergency services. Write down what time symptoms started. You have other signs of  a stroke, such as:  A sudden, very bad headache with no known cause.  Feeling like you may vomit.  Vomiting.  A seizure. These symptoms may be an emergency. Do not wait to see if the symptoms will go away. Get medical help right away. Call your local emergency services (911 in the [place] Do not drive yourself to the hospital.   Summary  An ischemic stroke is the sudden death of brain tissue.  Symptoms of a stroke often happen all of a sudden.  You must get treatment as soon as you have stroke symptoms.  Stroke is an emergency. It must be treated right away. This information is not intended to replace advice given to you by your health care provider. Make sure you discuss any questions you have with your health care provider. Document Revised: 02/12/2019 Document Reviewed: 02/12/2019 Elsevier Patient Education  2021 [employer]vier Inc.  Stroke Prevention Some medical conditions and lifestyle choices can lead to a higher risk for a stroke. You can help to prevent a stroke by eating healthy foods and exercising. It also helps to not smoke and to manage any health problems you may have. How can this condition affect me? A stroke is an emergency. It should be treated right away. A  stroke can lead to brain damage or threaten your life. There is a better chance of surviving and getting better after a stroke if you get medical help right away. What can increase my risk? The following medical conditions may increase your risk of a stroke:  Diseases of the heart and blood vessels (cardiovascular disease).  High blood pressure (hypertension).  Diabetes.  High cholesterol.  Sickle cell disease.  Problems with blood clotting.  Being very overweight.  Sleeping problems (obstructivesleep apnea). Other risk factors include:  Being older than age 45.  A history of blood clots, stroke, or mini-stroke (TIA).  Race, ethnic background, or a family history of stroke.  Smoking or using tobacco products.  Taking birth control pills, especially if you smoke.  Heavy alcohol and drug use.  Not being active. What actions can I take to prevent this? Manage your health conditions  High cholesterol. ? Eat a healthy diet. If this is not enough to manage your cholesterol, you may need to take medicines. ? Take medicines as told by your doctor.  High blood pressure. ? Try to keep your blood pressure below 130/80. ? If your blood pressure cannot be managed through a healthy diet and regular exercise, you may need to take medicines. ? Take medicines as told by your doctor. ? Ask your doctor if you should check your blood pressure at home. ? Have your blood pressure checked every year.  Diabetes. ? Eat a healthy diet and get regular exercise. If your blood sugar (glucose) cannot be managed through diet and exercise, you may need to take medicines. ? Take medicines as told by your doctor.  Talk to your doctor about getting checked for sleeping problems. Signs of a problem can include: ? Snoring a lot. ? Feeling very tired.  Make sure that you manage any other conditions you have. Nutrition  Follow instructions from your doctor about what to eat or drink. You may be  told to: ? Eat and drink fewer calories each day. ? Limit how much salt (sodium) you use to 1,500 milligrams (mg) each day. ? Use only healthy fats for cooking, such as olive oil, canola oil, and sunflower oil. ? Eat healthy foods. To do this:  Choose foods that are high in fiber. These include whole grains, and fresh fruits and vegetables.  Eat at least 5 servings of fruits and vegetables a day. Try to fill one-half of your plate with fruits and vegetables at each meal.  Choose low-fat (lean) proteins. These include low-fat cuts of meat, chicken without skin, fish, tofu, beans, and nuts.  Eat low-fat dairy products. ? Avoid foods that:  Are high in salt.  Have saturated fat.  Have trans fat.  Have cholesterol.  Are processed or pre-made. ? Count how many carbohydrates you eat and drink each day.   Lifestyle  If you drink alcohol: ? Limit how much you have to:  0-1 drink a day for women who are not pregnant.  0-2 drinks a day for men. ? Know how much alcohol is in your drink. In the [place] one drink equals one 12 oz bottle of beer (355mL), one 5 oz glass of wine (148mL), or one 1 oz glass of hard liquor (44mL).  Do not smoke or use any products that have nicotine or tobacco. If you need help quitting, ask your doctor.  Avoid secondhand smoke.  Do not use drugs. Activity  Try to stay at a healthy weight.  Get at least 30 minutes of exercise on most days, such as: ? Fast walking. ? Biking. ? Swimming.   Medicines  Take over-the-counter and prescription medicines only as told by your doctor.  Avoid taking birth control pills. Talk to your doctor about the risks of taking birth control pills if: ? You are over 73 years old. ? You smoke. ? You get very bad headaches. ? You have had a blood clot. Where to find more information  American Stroke Association: www.strokeassociation.org Get help right away if:  You or a loved one has any signs of a stroke. "BE FAST"  is an easy way to remember the warning signs: ? B - Balance. Dizziness, sudden trouble walking, or loss of balance. ? E - Eyes. Trouble seeing or a change in how you see. ? F - Face. Sudden weakness or loss of feeling of the face. The face or eyelid may droop on one side. ? A - Arms. Weakness or loss of feeling in an arm. This happens all of a sudden and most often on one side of the body. ? S - Speech. Sudden trouble speaking, slurred speech, or trouble understanding what people say. ? T - Time. Time to call emergency services. Write down what time symptoms started.  You or a loved one has other signs of a stroke, such as: ? A sudden, very bad headache with no known cause. ? Feeling like you may vomit (nausea). ? Vomiting. ? A seizure. These symptoms may be an emergency. Get help right away. Call your local emergency services (911 in the [place]  Do not wait to see if the symptoms will go away.  Do not drive yourself to the hospital. Summary  You can help to prevent a stroke by eating healthy, exercising, and not smoking. It also helps to manage any health problems you have.  Do not smoke or use any products that contain nicotine or tobacco.  Get help right away if you or a loved one has any signs of a stroke. This information is not intended to replace advice given to you by your health care provider. Make sure you discuss any questions you have with your health care provider. Document Revised: 09/17/2019 Document Reviewed: 09/17/2019 Elsevier Patient  Education  2021 [employer].   Warning Signs of a Stroke A stroke is a medical emergency and should be treated right away--every second counts. A stroke is caused by a decrease or block in blood flow to the brain. When certain areas of the brain do not get enough oxygen, brain cells begin to die. A stroke can lead to brain damage and can sometimes be life-threatening. However, if a person gets medical treatment right away, he or she  has a better chance of surviving and recovering from a stroke. It is very important to be able to recognize the symptoms of a stroke. Types of strokes There are two main types of strokes:  Ischemic stroke. This is the most common type. This stroke happens when a blood vessel that supplies blood to the brain is blocked.  Hemorrhagic stroke. This results from bleeding in the brain when a blood vessel leaks or bursts (ruptures). A transient ischemic attack (TIA) causes stroke-like symptoms that go away quickly. Unlike a stroke, a TIA does not cause permanent damage to the brain. However, the symptoms of a TIA are the same as a stroke. TIAs also require medical treatment right away. Having a TIA is a sign that you are at higher risk for a stroke. Warning signs of a stroke The symptoms of stroke may vary and will reflect the part of the brain that is involved. Symptoms usually happen suddenly. "BE FAST" is an easy way to remember the main warning signs of a stroke:  B - Balance. Signs are dizziness, sudden trouble walking, or loss of balance.  E - Eyes. Signs are trouble seeing or a sudden change in vision.  F - Face. Signs are sudden weakness or numbness of the face, or the face or eyelid drooping on one side.  A - Arms. Signs are weakness or numbness in an arm. This happens suddenly and usually on one side of the body.  S - Speech. Signs are sudden trouble speaking, slurred speech, or trouble understanding what people say.  T - Time. Time to call emergency services. Write down what time symptoms started. Other signs of a stroke Some less common signs of a stroke include:  A sudden, severe headache with no known cause.  Nausea or vomiting.  Seizure. A stroke may be happening even if only one "BE FAST" symptom is present. These symptoms may represent a serious problem that is an emergency. Do not wait to see if the symptoms will go away. Get medical help right away. Call your local  emergency services (911 in the [place] Do not drive yourself to the hospital.   Summary  A stroke is a medical emergency and should be treated right away--every second counts.  "BE FAST" is an easy way to remember the main warning signs of a stroke.  Call your local emergency services right away if you or someone else has any stroke symptoms, even if the symptoms go away.  Make note of what time the first symptoms appeared. Emergency responders or emergency room staff will need to know this information.  Do not wait to see if symptoms will go away. Call 911 even if only one of the "BE FAST" symptoms appears. This information is not intended to replace advice given to you by your health care provider. Make sure you discuss any questions you have with your health care provider. Document Revised: 09/17/2019 Document Reviewed: 09/17/2019 Elsevier Patient Education  2021 [employer].

## 2020-07-13 NOTE — Progress Notes (Signed)
Provided discharge paperwork and answered all questioned to patient satisfaction.

## 2020-07-13 NOTE — Care Management Obs Status (Signed)
MEDICARE OBSERVATION STATUS NOTIFICATION   Patient Details  Name: Thomas Harrison MRN: [id number] Date of Birth: [birth year]   Medicare Observation Status Notification Given:  Yes    Bess Kinds, RN 07/13/2020, 3:31 PM

## 2020-07-13 NOTE — Progress Notes (Signed)
SLP Cancellation Note  Patient Details Name: Thomas Harrison MRN: [id number] DOB: 10/23/1945   Cancelled treatment:       Reason Eval/Treat Not Completed: Pt currently being discharged.  No cognitive-linguistic concerns reported by patient or RN.  Will s/o at this time.    Shanon Rosser Dorethia Jeanmarie 07/13/2020, 3:05 PM

## 2020-07-13 NOTE — Care Management CC44 (Signed)
Condition Code 44 Documentation Completed  Patient Details  Name: Thomas Harrison MRN: [id number] Date of Birth: [birth year]   Condition Code 44 given:  Yes Patient signature on Condition Code 44 notice:  Yes Documentation of 2 MD's agreement:  Yes Code 44 added to claim:  Yes    Bess Kinds, RN 07/13/2020, 3:31 PM

## 2020-07-15 ENCOUNTER — Telehealth: Payer: Self-pay | Admitting: Emergency Medicine

## 2020-07-15 NOTE — Telephone Encounter (Signed)
-----   Message from Micki Riley, MD sent at 07/14/2020  6:53 PM EDT ----- Regarding: RE: Please Advise I have not seen this patient and this should be a new consult for me.  I will try to see if I can see him sooner and we will copy my nurse to arrange this ----- Message ----- From: Carlus Pavlov Sent: 07/14/2020  12:06 PM EDT To: Micki Riley, MD Subject: Please Advise                                  Hi Dr. Pearlean Brownie,  I have this patient scheduled for a hospital f/u with you on July 27th, they were discharged home on 5/15. He expressed concern that this f/u is too far out, I let them know I would verify with you that this appointment is okay to keep, and I added him onto your cancellation list.   Thank you!

## 2020-07-15 NOTE — Telephone Encounter (Signed)
Patient returned call.  Explained there are no earlier appointments and offered to place patient on cancellation list.  He declined and said the 7/27 appointment would be fine.  Patient denied further questions, verbalized understanding and expressed appreciation for the phone call.

## 2020-07-15 NOTE — Telephone Encounter (Signed)
Called patient with no answer, unable to LVM due to VM  not being set up. Will try again

## 2020-09-24 ENCOUNTER — Inpatient Hospital Stay: Payer: Self-pay | Admitting: Neurology

## 2020-09-24 ENCOUNTER — Encounter: Payer: Self-pay | Admitting: Neurology

## 2021-10-07 ENCOUNTER — Other Ambulatory Visit: Payer: Self-pay

## 2021-10-07 ENCOUNTER — Encounter (HOSPITAL_COMMUNITY): Payer: Self-pay | Admitting: Emergency Medicine

## 2021-10-07 ENCOUNTER — Ambulatory Visit (HOSPITAL_COMMUNITY)
Admission: EM | Admit: 2021-10-07 | Discharge: 2021-10-07 | Disposition: A | Discharge status: Home / Self Care | Payer: Medicare Other

## 2021-10-07 ENCOUNTER — Emergency Department (HOSPITAL_COMMUNITY)
Admission: EM | Admit: 2021-10-07 | Discharge: 2021-10-07 | Discharge status: Against Medical Advice | Payer: Medicare Other | Attending: Emergency Medicine | Admitting: Emergency Medicine

## 2021-10-07 DIAGNOSIS — Y990 Civilian activity done for income or pay: Secondary | ICD-10-CM | POA: Insufficient documentation

## 2021-10-07 DIAGNOSIS — S51831A Puncture wound without foreign body of right forearm, initial encounter: Secondary | ICD-10-CM | POA: Diagnosis not present

## 2021-10-07 DIAGNOSIS — S51811A Laceration without foreign body of right forearm, initial encounter: Secondary | ICD-10-CM

## 2021-10-07 DIAGNOSIS — Z5321 Procedure and treatment not carried out due to patient leaving prior to being seen by health care provider: Secondary | ICD-10-CM | POA: Insufficient documentation

## 2021-10-07 DIAGNOSIS — W450XXA Nail entering through skin, initial encounter: Secondary | ICD-10-CM | POA: Insufficient documentation

## 2021-10-07 DIAGNOSIS — S59911A Unspecified injury of right forearm, initial encounter: Secondary | ICD-10-CM | POA: Diagnosis present

## 2021-10-07 DIAGNOSIS — S51801A Unspecified open wound of right forearm, initial encounter: Secondary | ICD-10-CM

## 2021-10-07 DIAGNOSIS — S56901A Unspecified injury of unspecified muscles, fascia and tendons at forearm level, right arm, initial encounter: Secondary | ICD-10-CM

## 2021-10-07 MED ORDER — LIDOCAINE-EPINEPHRINE 1 %-1:100000 IJ SOLN
INTRAMUSCULAR | Status: AC
  Filled 2021-10-07: qty 1

## 2021-10-07 NOTE — ED Notes (Signed)
Cleaned wound with chlorhexidine externally and sterile water. Patient tolerated well.

## 2021-10-07 NOTE — ED Provider Notes (Addendum)
  [facility]    CSN: [id number] Arrival date & time: 10/07/21  1246    HISTORY   Chief Complaint  Patient presents with   Laceration   HPI Thomas Harrison is a pleasant, 76 y.o. male who presents to urgent care. Pt states he was working on his car earlier today when something with exposed screws fell off of the engine mount and gouged his anterior right forearm. States last tetanus shot was 06/30/2019. Bleeding has been well controled with pressure.  Patient denies numbness/tingling to his distal arm, left hand, left fingers.    The history is provided by the patient.   Triage Vital Signs ED Triage Vitals  Enc Vitals Group     BP 12/26/20 0827 (!) 147/82     Pulse Rate 12/26/20 0827 72     Resp 12/26/20 0827 18     Temp 12/26/20 0827 98.3 F (36.8 C)     Temp Source 12/26/20 0827 Oral     SpO2 12/26/20 0827 98 %     Weight --      Height --      Head Circumference --      Peak Flow --      Pain Score 12/26/20 0826 5     Pain Loc --      Pain Edu? --      Excl. in GC? --   No data found.  Updated Vital Signs BP (!) 168/101 (BP Location: Right Arm)   Pulse 72   Temp 98.9 F (37.2 C) (Oral)   Resp 16   SpO2 99%   I have reviewed the triage vital signs and the nursing notes.  LIMITED PHYSICAL EXAM: Vital signs and nursing note reviewed.  Patient  demonstrates full range of motion but reports pain when doing so.  Tips of fingers are warm to touch and patient has normal capillary refill.  Wound is 3 cm in length and a semilunar pattern.  Dermal layer is separated from deep fascia, extensor tendons are easily visualized and tender to palpation surrounding wound.  Final diagnoses:  Laceration of skin of right forearm, initial encounter  Open wound of right forearm with tendon involvement, initial encounter   Patient advised to the emergency room now for further evaluation of his tendon given that I can fully visualize an extensor tendon in the open wound and  patient has pain with palpation of tendons surrounding the wound.  PDMP not reviewed this encounter.  Disposition Upon Discharge:  Condition: stable for discharge Emergency Room via  ambulation from [facility].   This office note has been dictated using Teaching laboratory technician.  Unfortunately, this method of dictation can sometimes lead to typographical or grammatical errors.  I apologize for your inconvenience in advance if this occurs.  Please do not hesitate to reach out to me if clarification is needed.      Theadora Rama Scales, PA-C 10/07/21 1351    Theadora Rama [place], [place] 10/09/21 1030

## 2021-10-07 NOTE — ED Triage Notes (Signed)
Pt presents with puncture wound to right forearm from a screw.  Pt sent by urgent care due to exposed tendon.

## 2021-10-07 NOTE — ED Notes (Signed)
Patient is being discharged from the Urgent Care and sent to the Emergency Department via POV . Per Theadora Rama PA, patient is in need of higher level of care due to laceration. Patient is aware and verbalizes understanding of plan of care.  Vitals:   10/07/21 1314  BP: (!) 168/101  Pulse: 72  Resp: 16  Temp: 98.9 F (37.2 C)  SpO2: 99%

## 2021-10-07 NOTE — ED Notes (Signed)
p 

## 2021-10-07 NOTE — ED Triage Notes (Signed)
The pt was sitting in the triage room with someones name  instead of his he was already triaged and the pt became upset when he was asked to wait aoutside in th e waiting room  not sure hes leaving as he threatened

## 2021-10-07 NOTE — ED Triage Notes (Addendum)
Working on car when something with exposed screws fell off of a mount and gouged his right arm. States last tetanus shot was a couple years ago. Bleeding controled with pressure, site appears crescent shaped to anterior right forearm. Denies numbness/tingling to distal arm/hand/fingers, ROM intact with pain, fingernails pink/warm, brisk cap refill.

## 2021-10-07 NOTE — ED Notes (Signed)
Pt counseled on [facility] policy. Patient encouraged to stay to be seen by provider, but he declined. Pt left ED.

## 2021-10-07 NOTE — ED Provider Triage Note (Signed)
Emergency Medicine Provider Triage Evaluation Note  Thomas Harrison , a 76 y.o. male  was evaluated in triage.  Pt complains of laceration while at work.  Patient was placing a nail and happened to get hit on the right forearm.  Does have a puncture wound to the area.  Last tetanus immunization approximately 3 years ago.  Went to urgent care but sent here for further evaluation as tendon was exposed.  Review of Systems  Positive: wound Negative:   Physical Exam  BP (!) 174/100   Pulse 61   Temp 98.4 F (36.9 C) (Oral)   Resp 16   SpO2 98%  Gen:   Awake, no distress   Resp:  Normal effort  MSK:   Moves extremities without difficulty  Other:  Small puncture wound noted to the right forearm with visibility of the tendon.  Has good extension and flexion.  Medical Decision Making  Medically screening exam initiated at 3:30 PM.  Appropriate orders placed.  Gorge Almanza was informed that the remainder of the evaluation will be completed by another provider, this initial triage assessment does not replace that evaluation, and the importance of remaining in the ED until their evaluation is complete.     Claude Manges, PA-C 10/07/21 1533

## 2021-10-12 ENCOUNTER — Other Ambulatory Visit: Payer: Self-pay | Admitting: Family Medicine

## 2021-10-12 DIAGNOSIS — R109 Unspecified abdominal pain: Secondary | ICD-10-CM

## 2021-10-14 ENCOUNTER — Other Ambulatory Visit: Payer: Medicare Other

## 2022-11-23 IMAGING — MR MR MRA HEAD W/O CM
12 of 14 series · 34 of 48 positions shown · non-contrast
Comparison: Noncontrast head CT performed earlier today.

CLINICAL DATA: Neuro deficit, acute, stroke suspected. Aphasia,
left-sided weakness.

EXAM:
MRI HEAD WITHOUT CONTRAST
MRA HEAD WITHOUT CONTRAST
TECHNIQUE: Multiplanar, multi-echo pulse sequences of the brain and surrounding
structures were acquired without intravenous contrast. Angiographic
images of the Circle of Willis were acquired using MRA technique
without intravenous contrast.

[Series 5: DWI · axial · 3.0mm · 0.88mm/px · z∈[-106,+40]mm · 6 of 100 slices shown (1 of 4)]
[im 1/100]
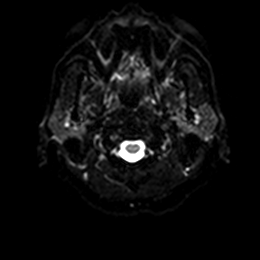
[im 20/100]
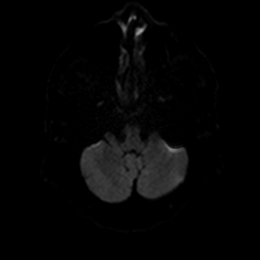
[im 40/100]
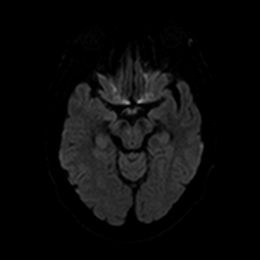
[im 60/100]
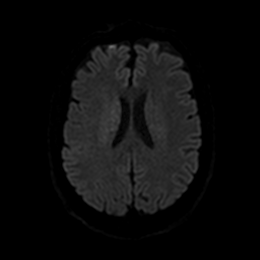
[im 80/100]
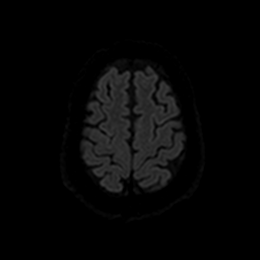
[im 100/100]
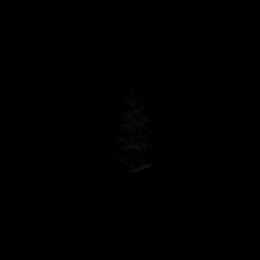

[Series 6: DWI · axial · 3.0mm · 0.88mm/px · z∈[-106,+40]mm · 3 of 48 slices shown (2 of 4)]
[im 1/48]
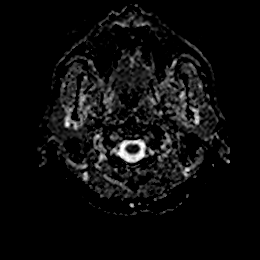
[im 24/48]
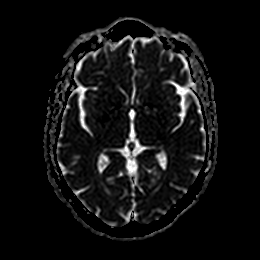
[im 48/48]
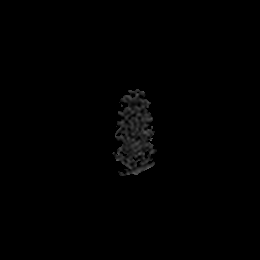

[Series 7: DWI · coronal · 4.0mm · 0.88mm/px · 4 of 64 slices shown (3 of 4)]
[im 1/64]
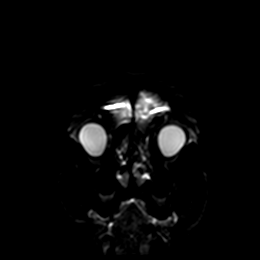
[im 22/64]
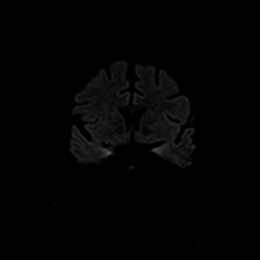
[im 43/64]
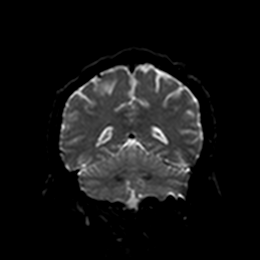
[im 64/64]
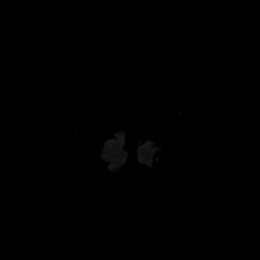

[Series 8: DWI · coronal · 4.0mm · 0.88mm/px · 2 of 31 slices shown (4 of 4)]
[im 1/31]
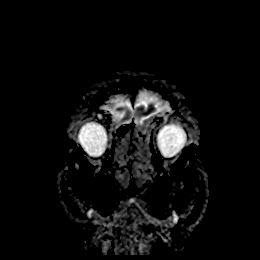
[im 31/31]
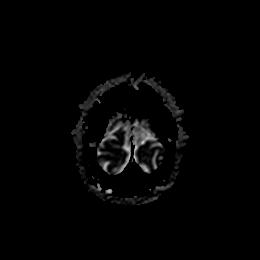

[Series 13: T1 · sagittal · 5.0mm · 0.75mm/px · 1 of 23 slices shown]
[im 1/23]
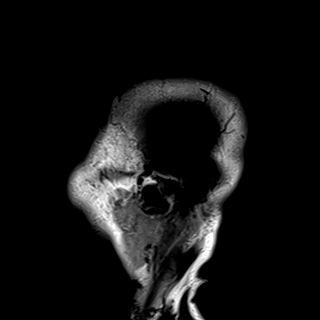

[Series 14: T2 · axial · 5.0mm · 0.72mm/px · 1 of 25 slices shown (1 of 2)]
[im 1/25]
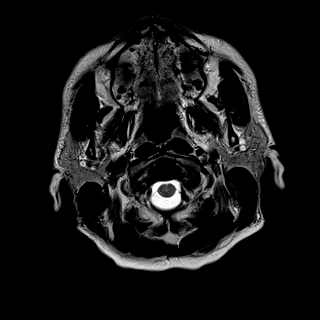

[Series 15: FLAIR · axial · 5.0mm · 0.45mm/px · 1 of 25 slices shown]
[im 1/25]
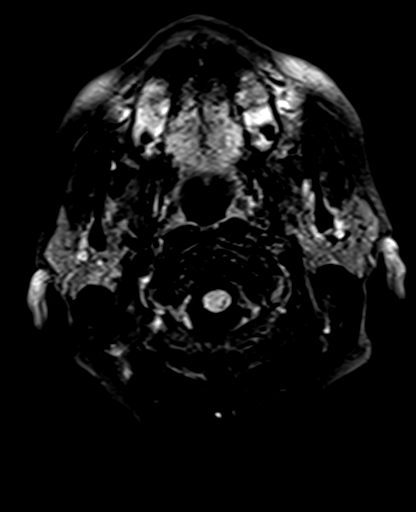

[Series 16: mag_images · axial · 3.0mm · 0.90mm/px · z∈[-122,+54]mm · 4 of 60 slices shown]
[im 1/60]
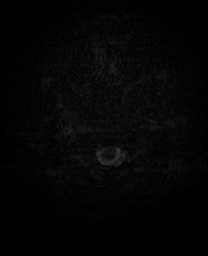
[im 20/60]
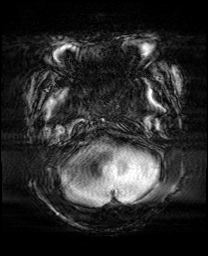
[im 40/60]
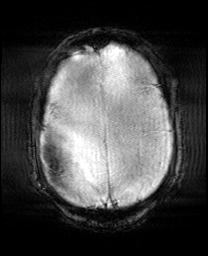
[im 60/60]
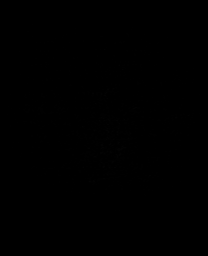

[Series 17: pha_images · axial · 3.0mm · 0.90mm/px · z∈[-122,+27]mm · 3 of 51 slices shown]
[im 1/51]
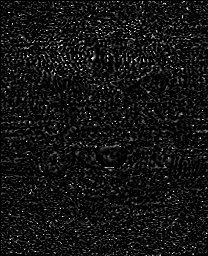
[im 26/51]
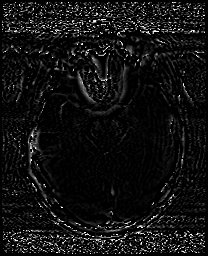
[im 51/51]
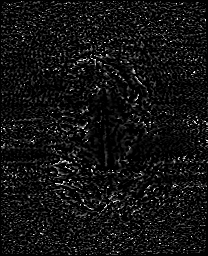

[Series 18: swi_images · axial · 3.0mm · 0.90mm/px · z∈[-122,+54]mm · 4 of 60 slices shown]
[im 1/60]
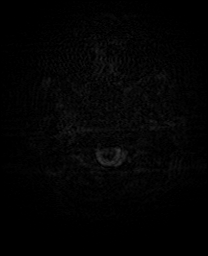
[im 20/60]
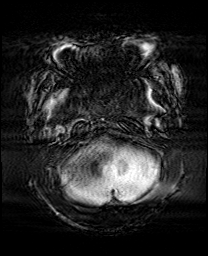
[im 40/60]
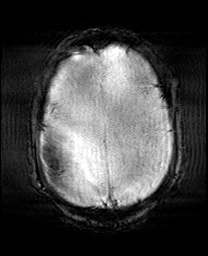
[im 60/60]
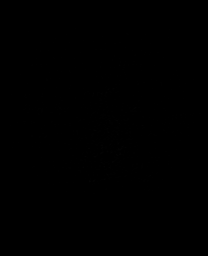

[Series 19: mip_images(sw) · axial · 24.0mm · 0.90mm/px · z∈[-112,+44]mm · 3 of 53 slices shown]
[im 1/53]
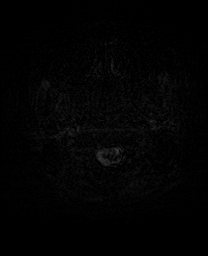
[im 27/53]
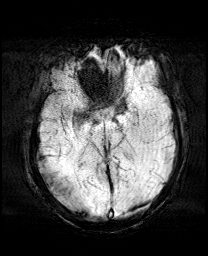
[im 53/53]
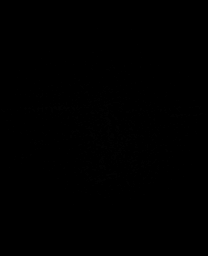

[Series 21: T2 · coronal · 5.0mm · 0.34mm/px · 2 of 29 slices shown (2 of 2)]
[im 1/29]
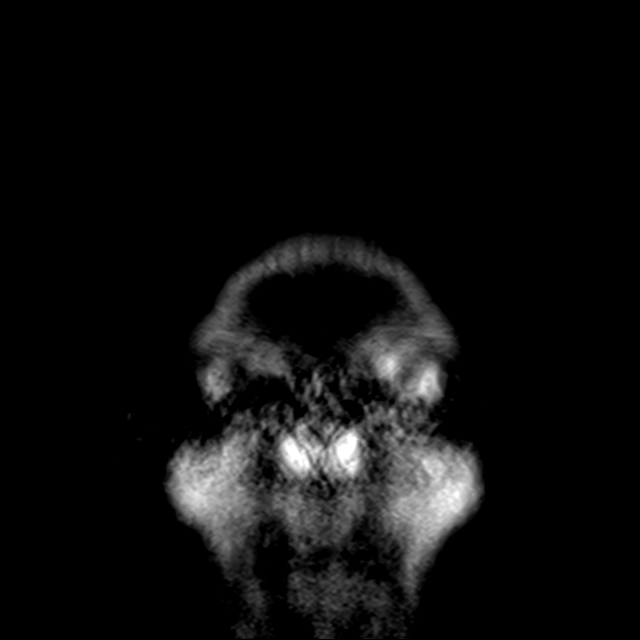
[im 29/29]
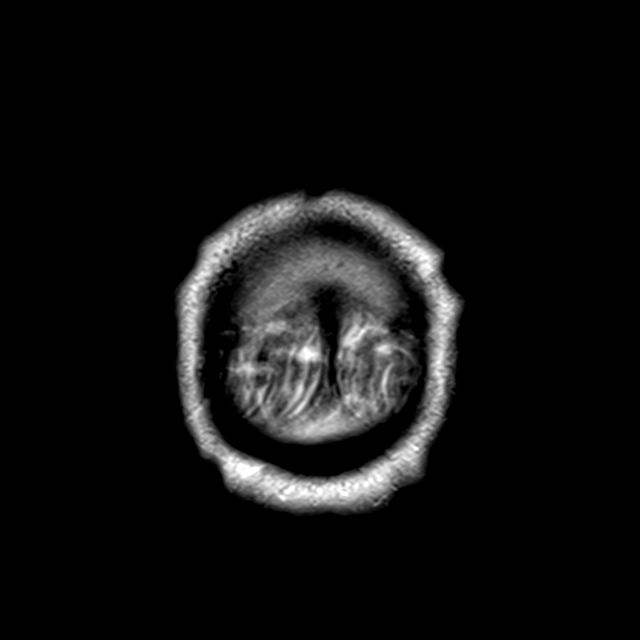

[34 of 48 positions shown; findings below may reference images not displayed]

FINDINGS: MRI HEAD FINDINGS

Brain:

Intermittently motion degraded examination. Most notably, there is
moderate motion degradation of the axial SWI sequence, moderate
motion degradation of the axial T1 weighted sequence and severe
motion degradation of the coronal T2 weighted sequence.

Cerebral volume is normal.

There is vague asymmetric diffusion weighted hyperintensity within
the right basal ganglia, most notably within the right caudate body,
consistent with acute infarction (for instance as seen on series 5,
image 80).

No cortical encephalomalacia is identified.

No significant white matter disease for age.

No evidence of intracranial mass.

No appreciable chronic intracranial blood products.

No extra-axial fluid collection.

No midline shift.

Vascular: Reported below.

Skull and upper cervical spine: No focal marrow lesion. Incompletely
assessed cervical spondylosis.

Sinuses/Orbits: Visualized orbits show no acute finding. Trace
bilateral ethmoid sinus mucosal thickening.

MRA HEAD FINDINGS

Anterior circulation:

The intracranial internal carotid arteries are patent. The proximal
M1 right middle cerebral artery is occluded shortly beyond its
origin. Minimal flow related signal is seen within right M2
branches, markedly diminished as compared to the left. The left M1
middle cerebral artery is patent. No left M2 proximal branch
occlusion or high-grade proximal stenosis. The anterior cerebral
arteries are patent. No intracranial aneurysm is identified.

Posterior circulation:

The intracranial vertebral arteries are patent. The basilar artery
is patent. The posterior cerebral arteries are patent.
Moderate/severe stenosis within a right PCA branch at the P2/P3
junction. Posterior communicating arteries are hypoplastic or absent
bilaterally.

Anatomic variants: As described.

These results were called by telephone at the time of interpretation
on 07/12/2020 at [DATE] to provider MARITSA GRAMS , who verbally
acknowledged these results.
IMPRESSION: MRI brain:

1. Motion degraded examination, as described.
2. Vague asymmetric diffusion weighted hyperintensity within the
right basal ganglia, most notably within the right caudate body,
consistent with acute infarction.
3. Otherwise unremarkable non-contrast MRI appearance of the brain
for age.

MRA head:

1. Occlusion of the proximal M1 right middle cerebral artery. Only
minimal flow related signal is seen within the several right M2
branches.
2. Moderate/severe stenosis within a right PCA branch at the P2/P3
junction.

## 2023-05-06 ENCOUNTER — Other Ambulatory Visit: Payer: Self-pay

## 2023-05-06 ENCOUNTER — Encounter (HOSPITAL_COMMUNITY): Payer: Self-pay | Admitting: Emergency Medicine

## 2023-05-06 ENCOUNTER — Emergency Department (HOSPITAL_COMMUNITY)
Admission: EM | Admit: 2023-05-06 | Discharge: 2023-05-06 | Disposition: A | Discharge status: Home / Self Care | Payer: Medicare (Managed Care) | Attending: Emergency Medicine | Admitting: Emergency Medicine

## 2023-05-06 DIAGNOSIS — R220 Localized swelling, mass and lump, head: Secondary | ICD-10-CM | POA: Diagnosis present

## 2023-05-06 DIAGNOSIS — Z79899 Other long term (current) drug therapy: Secondary | ICD-10-CM | POA: Diagnosis not present

## 2023-05-06 DIAGNOSIS — I10 Essential (primary) hypertension: Secondary | ICD-10-CM | POA: Insufficient documentation

## 2023-05-06 DIAGNOSIS — K0889 Other specified disorders of teeth and supporting structures: Secondary | ICD-10-CM | POA: Insufficient documentation

## 2023-05-06 DIAGNOSIS — Z7982 Long term (current) use of aspirin: Secondary | ICD-10-CM | POA: Insufficient documentation

## 2023-05-06 MED ORDER — HYDROCODONE-ACETAMINOPHEN 5-325 MG PO TABS: 1.0000 | ORAL_TABLET | Freq: Four times a day (QID) | ORAL | 0 refills | Status: AC | PRN

## 2023-05-06 MED ORDER — IBUPROFEN 400 MG PO TABS
400.0000 mg | ORAL_TABLET | Freq: Once | ORAL | Status: AC
  Administered 2023-05-06: 400 mg via ORAL
  Filled 2023-05-06: qty 1

## 2023-05-06 NOTE — Discharge Instructions (Addendum)
 Take ibuprofen 400 mg over-the-counter 3 times a day Take the Vicodin as needed for severe pain every 6 hours while at home Follow-up with your dentist next week

## 2023-05-06 NOTE — ED Provider Notes (Signed)
 [place] [facility] AT [facility] Provider Note   CSN: [id number] Arrival date & time: 05/06/23  0404     History  Chief Complaint  Patient presents with   Facial Swelling    Thomas Harrison is a 78 y.o. male.  The history is provided by the patient.  Patient with history of hypertension hyperlipidemia presents with dental pain.  Patient reports left-sided pain and swelling throughout his teeth and gums.  He recently was started on amoxicillin per his dentist.  Reports the pain at home is not relieved by Tylenol.  No fevers or vomiting.  No arm or leg weakness.  No facial numbness or weakness.  No slurred speech.  No chest pain or shortness of breath is reported    Past Medical History:  Diagnosis Date   Hyperlipidemia    Hypertension     Home Medications Prior to Admission medications   Medication Sig Start Date End Date Taking? Authorizing Provider  HYDROcodone-acetaminophen (NORCO/VICODIN) 5-325 MG tablet Take 1 tablet by mouth every 6 (six) hours as needed for severe pain (pain score 7-10). 05/06/23  Yes Zadie Rhine, MD  aspirin EC 325 MG EC tablet Take 1 tablet (325 mg total) by mouth daily. 07/14/20   Marvel Plan, MD  atorvastatin (LIPITOR) 80 MG tablet Take 1 tablet (80 mg total) by mouth daily. 07/14/20   Marvel Plan, MD  Cholecalciferol (VITAMIN D3) 25 MCG (1000 UT) CAPS Take 1 capsule by mouth daily.    [provider]  nicotine (NICODERM CQ - DOSED IN MG/24 HOURS) 21 mg/24hr patch Place 1 patch (21 mg total) onto the skin daily as needed (assist with smoking cessation). 07/13/20   Marvel Plan, MD  pantoprazole (PROTONIX) 20 MG tablet Take 20 mg by mouth daily.    [provider]  tamsulosin (FLOMAX) 0.4 MG CAPS capsule Take 0.4 mg by mouth daily.    [provider]      Allergies    Patient has no known allergies.    Review of Systems   Review of Systems  Constitutional:  Negative for fever.  HENT:  Positive for  dental problem.   Eyes:  Negative for visual disturbance.  Respiratory:  Negative for shortness of breath.   Cardiovascular:  Negative for chest pain.  Gastrointestinal:  Negative for vomiting.  Neurological:  Negative for facial asymmetry, weakness and numbness.    Physical Exam Updated Vital Signs BP 125/88 (BP Location: Right Arm)   Pulse (!) 113   Temp 98.1 F (36.7 C)   Resp 18   Ht 1.753 m (5\' 9" )   Wt 72.6 kg   SpO2 98%   BMI 23.63 kg/m  Physical Exam CONSTITUTIONAL: Well developed/well nourished, elderly, no acute distress HEAD AND FACE: Normocephalic/atraumatic EYES: EOMI/PERRL, no proptosis ENMT: Mucous membranes moist.  Poor dentition.  No trismus.  No focal abscess noted. Diffuse tenderness throughout his remaining teeth and gingiva NECK: supple no meningeal signs NEURO: Pt is awake/alert, moves all extremitiesx4 No facial droop, no arm or leg drift EXTREMITIES:full ROM SKIN: warm, color normal  ED Results / Procedures / Treatments   Labs (all labs ordered are listed, but only abnormal results are displayed) Labs Reviewed - No data to display  EKG None  Radiology No results found.  Procedures Procedures    Medications Ordered in ED Medications  ibuprofen (ADVIL) tablet 400 mg (400 mg Oral Given 05/06/23 0458)    ED Course/ Medical Decision Making/ A&P  Medical Decision Making Risk Prescription drug management.   Patient presents with dental pain and swelling.  He is already on antibiotics.  Will provide short course of pain medications and he plans to follow-up with his dentist        Final Clinical Impression(s) / ED Diagnoses Final diagnoses:  Pain, dental    Rx / DC Orders ED Discharge Orders          Ordered    HYDROcodone-acetaminophen (NORCO/VICODIN) 5-325 MG tablet  Every 6 hours PRN        05/06/23 0434              Zadie Rhine, MD 05/06/23 [phone]

## 2023-05-06 NOTE — ED Triage Notes (Signed)
 Patient reports left side facial pain and swelling. Patient states he had toothache on left side and was prescribed antibiotics by dentist on 3/5. No missed doses. Swelling and increased pain developed today.

## 2023-05-19 ENCOUNTER — Other Ambulatory Visit: Payer: Self-pay | Admitting: Family Medicine

## 2023-05-19 ENCOUNTER — Ambulatory Visit
Admission: RE | Admit: 2023-05-19 | Discharge: 2023-05-19 | Disposition: A | Discharge status: Home / Self Care | Payer: Medicare (Managed Care) | Source: Ambulatory Visit | Attending: Family Medicine | Admitting: Family Medicine

## 2023-05-19 DIAGNOSIS — J449 Chronic obstructive pulmonary disease, unspecified: Secondary | ICD-10-CM
